# Patient Record
Sex: Female | Born: 1970 | Race: White | Hispanic: No | Marital: Married | Smoking: Never smoker
Health system: Southern US, Community
[De-identification: ages and names within clinical notes are randomized; demographics above are authoritative.]

## PROBLEM LIST (undated history)

## (undated) DIAGNOSIS — I10 Essential (primary) hypertension: Secondary | ICD-10-CM

## (undated) DIAGNOSIS — N183 Chronic kidney disease, stage 3 unspecified: Secondary | ICD-10-CM

## (undated) DIAGNOSIS — E785 Hyperlipidemia, unspecified: Secondary | ICD-10-CM

## (undated) DIAGNOSIS — R7303 Prediabetes: Secondary | ICD-10-CM

## (undated) DIAGNOSIS — Z789 Other specified health status: Secondary | ICD-10-CM

## (undated) HISTORY — PX: ABDOMINAL HYSTERECTOMY: SHX81

## (undated) HISTORY — PX: WISDOM TOOTH EXTRACTION: SHX21

---

## 1999-09-30 HISTORY — PX: BREAST SURGERY: SHX581

## 1999-09-30 HISTORY — PX: BREAST EXCISIONAL BIOPSY: SUR124

## 1999-09-30 HISTORY — PX: BREAST BIOPSY: SHX20

## 2013-02-22 ENCOUNTER — Ambulatory Visit: Payer: Self-pay | Admitting: Family Medicine

## 2013-12-06 ENCOUNTER — Encounter (HOSPITAL_BASED_OUTPATIENT_CLINIC_OR_DEPARTMENT_OTHER): Payer: Self-pay | Admitting: *Deleted

## 2013-12-07 NOTE — H&P (Signed)
  Barnie Sopko/[facility] [address]   [address] [place], [place] [id number] [phone] A Division of [facility]  Loreta Aveaniel F. Juliah Scadden, M.D.   Robert A. Thurston HoleWainer, M.D.   Burnell BlanksW. Dan Caffrey, M.D.   Eulas PostJoshua P. Landau, M.D.   Lunette StandsAnna Voytek, M.D Jewel Baizeimothy D. Eulah PontMurphy, M.D.  Buford DresserWesley R. Ibazebo, M.D.  Estell HarpinJames S. Kramer, M.D.    Melina Fiddlerebecca S. Bassett, M.D. Mary L. Isidoro DonningAnton, PA-C  Kirstin A. Shepperson, PA-C  Josh Urbannahadwell, PA-C [place]andon Parry, [place]A-C   RE: Jaclyn DaneSomers, Latrisha   [phone]      DOB: 08/31/1971 PROGRESS NOTE: 12-06-13 Reason for visit:  [facility]. History of present illness: 43 year old woman who tripped over an acorn and fell hurting her right ankle.    Please see associated documentation for this clinic visit for further past medical, family, surgical and social history, review of systems, and exam findings as this was reviewed by me.  EXAMINATION: Well appearing female in no apparent distress. Right lower extremity splint is intact she is able to wiggle her toes, her toes are warm and well profused. No other injury.  X-RAYS: X-rays reviewed by me: 3 views obtained demonstrate a right ankle fracture.   ASSESSMENT: Right bimalleolar ankle fracture.  PLAN: 1. Plan for open reduction internal fixation at next available time. 2. I advised her to elevate as much as possible before and after surgery 3. I will see her at my next available surgical appointment.  Jewel Baizeimothy D.  Eulah PontMurphy, M.D.  Electronically verified by Jewel Baizeimothy D. Eulah PontMurphy, M.D. TDM:kah D 12-06-13 T 12-07-13

## 2013-12-08 ENCOUNTER — Ambulatory Visit (HOSPITAL_BASED_OUTPATIENT_CLINIC_OR_DEPARTMENT_OTHER): Payer: 59 | Admitting: Anesthesiology

## 2013-12-08 ENCOUNTER — Encounter (HOSPITAL_BASED_OUTPATIENT_CLINIC_OR_DEPARTMENT_OTHER)
Admission: RE | Disposition: A | Discharge status: Home / Self Care | Payer: Self-pay | Source: Ambulatory Visit | Attending: Orthopedic Surgery

## 2013-12-08 ENCOUNTER — Encounter (HOSPITAL_BASED_OUTPATIENT_CLINIC_OR_DEPARTMENT_OTHER): Payer: 59 | Admitting: Anesthesiology

## 2013-12-08 ENCOUNTER — Ambulatory Visit (HOSPITAL_BASED_OUTPATIENT_CLINIC_OR_DEPARTMENT_OTHER)
Admission: RE | Admit: 2013-12-08 | Discharge: 2013-12-08 | Disposition: A | Discharge status: Home / Self Care | Payer: 59 | Source: Ambulatory Visit | Attending: Orthopedic Surgery | Admitting: Orthopedic Surgery

## 2013-12-08 ENCOUNTER — Encounter (HOSPITAL_BASED_OUTPATIENT_CLINIC_OR_DEPARTMENT_OTHER): Payer: Self-pay | Admitting: *Deleted

## 2013-12-08 DIAGNOSIS — S82853A Displaced trimalleolar fracture of unspecified lower leg, initial encounter for closed fracture: Secondary | ICD-10-CM | POA: Insufficient documentation

## 2013-12-08 DIAGNOSIS — W010XXA Fall on same level from slipping, tripping and stumbling without subsequent striking against object, initial encounter: Secondary | ICD-10-CM | POA: Insufficient documentation

## 2013-12-08 DIAGNOSIS — S82891A Other fracture of right lower leg, initial encounter for closed fracture: Secondary | ICD-10-CM

## 2013-12-08 HISTORY — DX: Other specified health status: Z78.9

## 2013-12-08 HISTORY — PX: ORIF ANKLE FRACTURE: SHX5408

## 2013-12-08 LAB — POCT HEMOGLOBIN-HEMACUE: Hemoglobin: 12.7 g/dL (ref 12.0–15.0)

## 2013-12-08 SURGERY — OPEN REDUCTION INTERNAL FIXATION (ORIF) ANKLE FRACTURE
Anesthesia: General | Site: Ankle | Laterality: Right

## 2013-12-08 MED ORDER — HYDROMORPHONE HCL PF 1 MG/ML IJ SOLN: 0.5000 mg | INTRAMUSCULAR | Status: DC | PRN

## 2013-12-08 MED ORDER — DEXTROSE-NACL 5-0.45 % IV SOLN: 100.0000 mL/h | INTRAVENOUS | Status: DC

## 2013-12-08 MED ORDER — DEXAMETHASONE SODIUM PHOSPHATE 10 MG/ML IJ SOLN
INTRAMUSCULAR | Status: DC | PRN
  Administered 2013-12-08: 6 mg

## 2013-12-08 MED ORDER — MIDAZOLAM HCL 2 MG/2ML IJ SOLN
INTRAMUSCULAR | Status: AC
  Filled 2013-12-08: qty 2

## 2013-12-08 MED ORDER — DEXAMETHASONE SODIUM PHOSPHATE 4 MG/ML IJ SOLN
INTRAMUSCULAR | Status: DC | PRN
  Administered 2013-12-08: 10 mg via INTRAVENOUS

## 2013-12-08 MED ORDER — CEFAZOLIN SODIUM-DEXTROSE 2-3 GM-% IV SOLR
INTRAVENOUS | Status: AC
  Filled 2013-12-08: qty 50

## 2013-12-08 MED ORDER — DOCUSATE SODIUM 100 MG PO CAPS: 100.0000 mg | ORAL_CAPSULE | Freq: Two times a day (BID) | ORAL | Status: DC

## 2013-12-08 MED ORDER — METOCLOPRAMIDE HCL 5 MG/ML IJ SOLN: 5.0000 mg | Freq: Three times a day (TID) | INTRAMUSCULAR | Status: DC | PRN

## 2013-12-08 MED ORDER — FENTANYL CITRATE 0.05 MG/ML IJ SOLN
INTRAMUSCULAR | Status: AC
  Filled 2013-12-08: qty 6

## 2013-12-08 MED ORDER — CEFAZOLIN SODIUM-DEXTROSE 2-3 GM-% IV SOLR
2.0000 g | INTRAVENOUS | Status: AC
  Administered 2013-12-08: 2 g via INTRAVENOUS

## 2013-12-08 MED ORDER — OXYCODONE HCL 5 MG PO TABS: 5.0000 mg | ORAL_TABLET | ORAL | Status: DC | PRN

## 2013-12-08 MED ORDER — BUPIVACAINE-EPINEPHRINE PF 0.5-1:200000 % IJ SOLN
INTRAMUSCULAR | Status: DC | PRN
  Administered 2013-12-08: 150 mg via PERINEURAL

## 2013-12-08 MED ORDER — ONDANSETRON HCL 4 MG/2ML IJ SOLN: 4.0000 mg | Freq: Four times a day (QID) | INTRAMUSCULAR | Status: DC | PRN

## 2013-12-08 MED ORDER — OXYCODONE HCL 5 MG PO TABS: 5.0000 mg | ORAL_TABLET | Freq: Once | ORAL | Status: DC | PRN

## 2013-12-08 MED ORDER — ONDANSETRON HCL 4 MG/2ML IJ SOLN
INTRAMUSCULAR | Status: DC | PRN
  Administered 2013-12-08: 4 mg via INTRAVENOUS

## 2013-12-08 MED ORDER — FENTANYL CITRATE 0.05 MG/ML IJ SOLN
INTRAMUSCULAR | Status: AC
  Filled 2013-12-08: qty 4

## 2013-12-08 MED ORDER — ASPIRIN EC 81 MG PO TBEC: 81.0000 mg | DELAYED_RELEASE_TABLET | Freq: Every day | ORAL | Status: DC

## 2013-12-08 MED ORDER — FENTANYL CITRATE 0.05 MG/ML IJ SOLN
INTRAMUSCULAR | Status: AC
  Filled 2013-12-08: qty 2

## 2013-12-08 MED ORDER — LIDOCAINE HCL (CARDIAC) 20 MG/ML IV SOLN
INTRAVENOUS | Status: DC | PRN
  Administered 2013-12-08: 40 mg via INTRAVENOUS

## 2013-12-08 MED ORDER — FENTANYL CITRATE 0.05 MG/ML IJ SOLN
INTRAMUSCULAR | Status: DC | PRN
  Administered 2013-12-08: 25 ug via INTRAVENOUS
  Administered 2013-12-08: 50 ug via INTRAVENOUS

## 2013-12-08 MED ORDER — PROMETHAZINE HCL 25 MG/ML IJ SOLN: 6.2500 mg | INTRAMUSCULAR | Status: DC | PRN

## 2013-12-08 MED ORDER — PROPOFOL 10 MG/ML IV BOLUS
INTRAVENOUS | Status: DC | PRN
  Administered 2013-12-08: 200 mg via INTRAVENOUS

## 2013-12-08 MED ORDER — ONDANSETRON HCL 4 MG PO TABS: 4.0000 mg | ORAL_TABLET | Freq: Three times a day (TID) | ORAL | Status: DC | PRN

## 2013-12-08 MED ORDER — LACTATED RINGERS IV SOLN
INTRAVENOUS | Status: DC
  Administered 2013-12-08: 11:00:00 via INTRAVENOUS

## 2013-12-08 MED ORDER — MIDAZOLAM HCL 2 MG/2ML IJ SOLN
1.0000 mg | INTRAMUSCULAR | Status: DC | PRN
Start: 2013-12-08 — End: 2013-12-08
  Administered 2013-12-08: 2 mg via INTRAVENOUS

## 2013-12-08 MED ORDER — HYDROMORPHONE HCL PF 1 MG/ML IJ SOLN: 0.2500 mg | INTRAMUSCULAR | Status: DC | PRN

## 2013-12-08 MED ORDER — OXYCODONE-ACETAMINOPHEN 5-325 MG PO TABS: 1.0000 | ORAL_TABLET | ORAL | Status: DC | PRN

## 2013-12-08 MED ORDER — ACETAMINOPHEN 500 MG PO TABS: 1000.0000 mg | ORAL_TABLET | Freq: Once | ORAL | Status: DC

## 2013-12-08 MED ORDER — METOCLOPRAMIDE HCL 5 MG PO TABS: 5.0000 mg | ORAL_TABLET | Freq: Three times a day (TID) | ORAL | Status: DC | PRN

## 2013-12-08 MED ORDER — FENTANYL CITRATE 0.05 MG/ML IJ SOLN
50.0000 ug | INTRAMUSCULAR | Status: DC | PRN
  Administered 2013-12-08: 100 ug via INTRAVENOUS

## 2013-12-08 MED ORDER — ONDANSETRON HCL 4 MG PO TABS: 4.0000 mg | ORAL_TABLET | Freq: Four times a day (QID) | ORAL | Status: DC | PRN

## 2013-12-08 MED ORDER — OXYCODONE HCL 5 MG/5ML PO SOLN: 5.0000 mg | Freq: Once | ORAL | Status: DC | PRN

## 2013-12-08 SURGICAL SUPPLY — 72 items
BANDAGE ELASTIC 4 VELCRO ST LF (GAUZE/BANDAGES/DRESSINGS) ×2 IMPLANT
BANDAGE ELASTIC 6 VELCRO ST LF (GAUZE/BANDAGES/DRESSINGS) ×2 IMPLANT
BANDAGE ESMARK 6X9 LF (GAUZE/BANDAGES/DRESSINGS) ×1 IMPLANT
BIT DRILL 2.5X125 (BIT) ×2 IMPLANT
BIT DRILL 3.5X125 (BIT) ×1 IMPLANT
BLADE SURG 15 STRL LF DISP TIS (BLADE) ×2 IMPLANT
BLADE SURG 15 STRL SS (BLADE) ×2
BNDG COHESIVE 3X5 TAN STRL LF (GAUZE/BANDAGES/DRESSINGS) ×2 IMPLANT
BNDG COHESIVE 4X5 TAN STRL (GAUZE/BANDAGES/DRESSINGS) IMPLANT
BNDG ESMARK 6X9 LF (GAUZE/BANDAGES/DRESSINGS) ×2
BRUSH SCRUB EZ PLAIN DRY (MISCELLANEOUS) ×2 IMPLANT
CHLORAPREP W/TINT 26ML (MISCELLANEOUS) IMPLANT
COVER MAYO STAND STRL (DRAPES) ×2 IMPLANT
COVER TABLE BACK 60X90 (DRAPES) ×2 IMPLANT
CUFF TOURNIQUET SINGLE 24IN (TOURNIQUET CUFF) IMPLANT
CUFF TOURNIQUET SINGLE 34IN LL (TOURNIQUET CUFF) ×2 IMPLANT
DECANTER SPIKE VIAL GLASS SM (MISCELLANEOUS) IMPLANT
DRAPE C-ARM 42X72 X-RAY (DRAPES) IMPLANT
DRAPE C-ARMOR (DRAPES) IMPLANT
DRAPE EXTREMITY T 121X128X90 (DRAPE) ×2 IMPLANT
DRAPE OEC MINIVIEW 54X84 (DRAPES) ×2 IMPLANT
DRAPE U 20/CS (DRAPES) ×2 IMPLANT
DRAPE U-SHAPE 47X51 STRL (DRAPES) ×2 IMPLANT
DRILL BIT 3.5X125 (BIT) ×1
DRSG EMULSION OIL 3X3 NADH (GAUZE/BANDAGES/DRESSINGS) ×4 IMPLANT
ELECT REM PT RETURN 9FT ADLT (ELECTROSURGICAL) ×2
ELECTRODE REM PT RTRN 9FT ADLT (ELECTROSURGICAL) ×1 IMPLANT
GLOVE BIO SURGEON STRL SZ7 (GLOVE) ×2 IMPLANT
GLOVE BIO SURGEON STRL SZ7.5 (GLOVE) ×2 IMPLANT
GLOVE BIOGEL PI IND STRL 7.0 (GLOVE) ×3 IMPLANT
GLOVE BIOGEL PI IND STRL 8 (GLOVE) ×1 IMPLANT
GLOVE BIOGEL PI INDICATOR 7.0 (GLOVE) ×3
GLOVE BIOGEL PI INDICATOR 8 (GLOVE) ×1
GLOVE ECLIPSE 6.5 STRL STRAW (GLOVE) ×2 IMPLANT
GOWN STRL REUS W/ TWL LRG LVL3 (GOWN DISPOSABLE) ×1 IMPLANT
GOWN STRL REUS W/ TWL XL LVL3 (GOWN DISPOSABLE) ×2 IMPLANT
GOWN STRL REUS W/TWL LRG LVL3 (GOWN DISPOSABLE) ×1
GOWN STRL REUS W/TWL XL LVL3 (GOWN DISPOSABLE) ×2
NEEDLE HYPO 22GX1.5 SAFETY (NEEDLE) IMPLANT
NS IRRIG 1000ML POUR BTL (IV SOLUTION) ×2 IMPLANT
PACK BASIN DAY SURGERY FS (CUSTOM PROCEDURE TRAY) ×2 IMPLANT
PAD ABD 8X10 STRL (GAUZE/BANDAGES/DRESSINGS) ×2 IMPLANT
PAD CAST 4YDX4 CTTN HI CHSV (CAST SUPPLIES) ×1 IMPLANT
PADDING CAST COTTON 4X4 STRL (CAST SUPPLIES) ×1
PADDING CAST COTTON 6X4 STRL (CAST SUPPLIES) ×2 IMPLANT
PENCIL BUTTON HOLSTER BLD 10FT (ELECTRODE) ×2 IMPLANT
PLATE TUBUAL 1/3 6H (Plate) ×2 IMPLANT
SCREW CANCELLOUS 4.0X14 (Screw) ×4 IMPLANT
SCREW CORT 10X3.5XST NS LF (Screw) ×1 IMPLANT
SCREW CORTEX ST MATTA 3.5X12MM (Screw) ×6 IMPLANT
SCREW CORTEX ST MATTA 3.5X22MM (Screw) ×2 IMPLANT
SCREW CORTEX ST MATTA 3.5X26MM (Screw) ×2 IMPLANT
SCREW CORTICAL 3.5 (Screw) ×1 IMPLANT
SLEEVE SCD COMPRESS KNEE MED (MISCELLANEOUS) ×2 IMPLANT
SPLINT FAST PLASTER 5X30 (CAST SUPPLIES) ×10
SPLINT PLASTER CAST FAST 5X30 (CAST SUPPLIES) ×10 IMPLANT
SPONGE GAUZE 4X4 12PLY (GAUZE/BANDAGES/DRESSINGS) ×2 IMPLANT
SPONGE LAP 4X18 X RAY DECT (DISPOSABLE) ×2 IMPLANT
STRIP CLOSURE SKIN 1/2X4 (GAUZE/BANDAGES/DRESSINGS) ×2 IMPLANT
SUCTION FRAZIER TIP 10 FR DISP (SUCTIONS) ×2 IMPLANT
SUT MON AB 2-0 CT1 36 (SUTURE) ×2 IMPLANT
SUT MON AB 4-0 PC3 18 (SUTURE) ×2 IMPLANT
SUT VIC AB 0 SH 27 (SUTURE) ×2 IMPLANT
SUT VIC AB 2-0 SH 27 (SUTURE)
SUT VIC AB 2-0 SH 27XBRD (SUTURE) IMPLANT
SYR 20CC LL (SYRINGE) IMPLANT
SYR BULB 3OZ (MISCELLANEOUS) ×2 IMPLANT
TOWEL OR 17X24 6PK STRL BLUE (TOWEL DISPOSABLE) ×4 IMPLANT
TOWEL OR NON WOVEN STRL DISP B (DISPOSABLE) ×2 IMPLANT
TRAY DSU PREP LF (CUSTOM PROCEDURE TRAY) ×2 IMPLANT
TUBE CONNECTING 20X1/4 (TUBING) ×2 IMPLANT
UNDERPAD 30X30 INCONTINENT (UNDERPADS AND DIAPERS) ×2 IMPLANT

## 2013-12-08 NOTE — Transfer of Care (Signed)
Immediate Anesthesia Transfer of Care Note  Patient: Jaclyn Rodriguez  Procedure(s) Performed: Procedure(s): RIGHT ANKLE: FRACTURE OPEN TREATMENT TRIMALLEOLARD ANKLE INCLUDES INTERNAL FIXATION WITHOUT FIXATION OF POSTERIOR LIP, FRACTURE OPEN TREATMENT DISTAL TIBIOFIBULAR INCLUDES INTERNAL FIXATION (Right)  Patient Location: PACU  Anesthesia Type:General  Level of Consciousness: awake and sedated  Airway & Oxygen Therapy: Patient Spontanous Breathing and Patient connected to face mask oxygen  Post-op Assessment: Report given to PACU RN and Post -op Vital signs reviewed and stable  Post vital signs: Reviewed and stable  Complications: No apparent anesthesia complications

## 2013-12-08 NOTE — Anesthesia Procedure Notes (Addendum)
Anesthesia Regional Block:  Popliteal block  Pre-Anesthetic Checklist: ,, timeout performed, Correct Patient, Correct Site, Correct Laterality, Correct Procedure, Correct Position, site marked, Risks and benefits discussed,  Surgical consent,  Pre-op evaluation,  At surgeon's request and post-op pain management  Laterality: Right  Prep: chloraprep       Needles:  Injection technique: Single-shot  Needle Type: Echogenic Stimulator Needle          Additional Needles:  Procedures: ultrasound guided (picture in chart) and nerve stimulator Popliteal block  Nerve Stimulator or Paresthesia:  Response: plantar flexion, 0.45 mA,   Additional Responses:   Narrative:  Start time: 12/08/2013 11:27 AM End time: 12/08/2013 11:37 AM  Performed by: Personally  Anesthesiologist: J. Adonis Hugueninan Singer, MD  Additional Notes: A functioning IV was confirmed and monitors were applied.  Sterile prep and drape, hand hygiene and sterile gloves were used.  Negative aspiration and test dose prior to incremental administration of local anesthetic. The patient tolerated the procedure well.Ultrasound  guidance: relevant anatomy identified, needle position confirmed, local anesthetic spread visualized around nerve(s), vascular puncture avoided.  Image printed for medical record.    Procedure Name: LMA Insertion Performed by: York GricePEARSON, Ian Cavey W Pre-anesthesia Checklist: Patient identified, Timeout performed, Emergency Drugs available, Suction available and Patient being monitored Patient Re-evaluated:Patient Re-evaluated prior to inductionOxygen Delivery Method: Circle system utilized Preoxygenation: Pre-oxygenation with 100% oxygen Intubation Type: IV induction Ventilation: Mask ventilation without difficulty LMA: LMA inserted LMA Size: 4.0 Tube type: Oral Number of attempts: 1 Placement Confirmation: breath sounds checked- equal and bilateral and positive ETCO2 Tube secured with: Tape Dental Injury: Teeth  and Oropharynx as per pre-operative assessment

## 2013-12-08 NOTE — Interval H&P Note (Signed)
History and Physical Interval Note:  12/08/2013 10:41 AM  Jaclyn Rodriguez  has presented today for surgery, with the diagnosis of Right Ankle Trimalleolar Fracture - Closed  The various methods of treatment have been discussed with the patient and family. After consideration of risks, benefits and other options for treatment, the patient has consented to  Procedure(s): RIGHT ANKLE: FRACTURE OPEN TREATMENT TRIMALLEOLARD ANKLE INCLUDES INTERNAL FIXATION WITHOUT FIXATION OF POSTERIOR LIP, FRACTURE OPEN TREATMENT DISTAL TIBIOFIBULAR INCLUDES INTERNAL FIXATION (Right) as a surgical intervention .  The patient's history has been reviewed, patient examined, no change in status, stable for surgery.  I have reviewed the patient's chart and labs.  Questions were answered to the patient's satisfaction.     Marl Seago, D

## 2013-12-08 NOTE — Anesthesia Preprocedure Evaluation (Signed)
Anesthesia Evaluation  Patient identified by MRN, date of birth, ID band Patient awake    Reviewed: Allergy & Precautions, H&P , NPO status , Patient's Chart, lab work & pertinent test results  Airway Mallampati: II TM Distance: >3 FB Neck ROM: full    Dental  (+) Teeth Intact, Dental Advidsory Given   Pulmonary neg pulmonary ROS,  breath sounds clear to auscultation        Cardiovascular negative cardio ROS  Rhythm:regular Rate:Normal     Neuro/Psych negative neurological ROS  negative psych ROS   GI/Hepatic negative GI ROS, Neg liver ROS,   Endo/Other  negative endocrine ROS  Renal/GU negative Renal ROS     Musculoskeletal   Abdominal   Peds  Hematology   Anesthesia Other Findings   Reproductive/Obstetrics negative OB ROS                           Anesthesia Physical Anesthesia Plan  ASA: I  Anesthesia Plan: General LMA   Post-op Pain Management: MAC Combined w/ Regional for Post-op pain   Induction:   Airway Management Planned:   Additional Equipment:   Intra-op Plan:   Post-operative Plan:   Informed Consent: I have reviewed the patients History and Physical, chart, labs and discussed the procedure including the risks, benefits and alternatives for the proposed anesthesia with the patient or authorized representative who has indicated his/her understanding and acceptance.   Dental Advisory Given  Plan Discussed with: Anesthesiologist, CRNA and Surgeon  Anesthesia Plan Comments:         Anesthesia Quick Evaluation

## 2013-12-08 NOTE — Discharge Instructions (Signed)
Non-weight bearing.  Must stay in splint until follow up appointment.  Take Aspirin 1 tab a day for the next 30 days to prevent blood clots. May shower, but do not let splint get wet.  May ice for up to 20 minutes at a time for pain and swelling.  Follow up appointment in one week or as scheduled.   SEEK MEDICAL CARE IF: You have swelling of your calf or leg.  You develop shortness of breath or chest pain.  You have redness, swelling, or increasing pain in the wound.  There is pus or any unusual drainage coming from the surgical site.  You notice a bad smell coming from the surgical site or dressing.  The surgical site breaks open after sutures or staples have been removed.  There is persistent bleeding from the suture or staple line.  You are getting worse or are not improving.  You have any other questions or concerns.  SEEK IMMEDIATE MEDICAL CARE IF:  You have a fever.  You develop a rash.  You have difficulty breathing.  You develop any reaction or side effects to medicines given.  Your knee motion is decreasing rather than improving.  MAKE SURE YOU:  Understand these instructions.  Will watch your condition.  Will get help right away if you are not doing well or get worse.   Regional Anesthesia Blocks  1. Numbness or the inability to move the "blocked" extremity may last from 3-48 hours after placement. The length of time depends on the medication injected and your individual response to the medication. If the numbness is not going away after 48 hours, call your surgeon.  2. The extremity that is blocked will need to be protected until the numbness is gone and the  Strength has returned. Because you cannot feel it, you will need to take extra care to avoid injury. Because it may be weak, you may have difficulty moving it or using it. You may not know what position it is in without looking at it while the block is in effect.  3. For blocks in the legs and feet, returning to weight  bearing and walking needs to be done carefully. You will need to wait until the numbness is entirely gone and the strength has returned. You should be able to move your leg and foot normally before you try and bear weight or walk. You will need someone to be with you when you first try to ensure you do not fall and possibly risk injury.  4. Bruising and tenderness at the needle site are common side effects and will resolve in a few days.  5. Persistent numbness or new problems with movement should be communicated to the surgeon or the [facility] [phone])/ [facility]  [phone]).  Post Anesthesia Home Care Instructions  Activity: Get plenty of rest for the remainder of the day. A responsible adult should stay with you for 24 hours following the procedure.  For the next 24 hours, DO NOT: -Drive a car -Advertising copywriterperate machinery -Drink alcoholic beverages -Take any medication unless instructed by your physician -Make any legal decisions or sign important papers.  Meals: Start with liquid foods such as gelatin or soup. Progress to regular foods as tolerated. Avoid greasy, spicy, heavy foods. If nausea and/or vomiting occur, drink only clear liquids until the nausea and/or vomiting subsides. Call your physician if vomiting continues.  Special Instructions/Symptoms: Your throat may feel dry or sore from the anesthesia or the  breathing tube placed in your throat during surgery. If this causes discomfort, gargle with warm salt water. The discomfort should disappear within 24 hours.

## 2013-12-08 NOTE — Progress Notes (Signed)
Assisted Dr. Singer with right, ultrasound guided, popliteal/saphenous block. Side rails up, monitors on throughout procedure. See vital signs in flow sheet. Tolerated Procedure well. 

## 2013-12-08 NOTE — Anesthesia Postprocedure Evaluation (Signed)
Anesthesia Post Note  Patient: Jaclyn Rodriguez  Procedure(s) Performed: Procedure(s) (LRB): RIGHT ANKLE: FRACTURE OPEN TREATMENT TRIMALLEOLARD ANKLE INCLUDES INTERNAL FIXATION WITHOUT FIXATION OF POSTERIOR LIP, FRACTURE OPEN TREATMENT DISTAL TIBIOFIBULAR INCLUDES INTERNAL FIXATION (Right)  Anesthesia type: general  Patient location: PACU  Post pain: Pain level controlled  Post assessment: Patient's Cardiovascular Status Stable  Last Vitals:  Filed Vitals:   12/08/13 1415  BP: 126/64  Pulse: 97  Temp:   Resp: 19    Post vital signs: Reviewed and stable  Level of consciousness: sedated  Complications: No apparent anesthesia complications

## 2013-12-08 NOTE — Op Note (Signed)
12/08/2013  12:42 PM  PATIENT:  Jaclyn DaneNorah Rodriguez    PRE-OPERATIVE DIAGNOSIS:  Right Ankle Trimalleolar Fracture - Closed  POST-OPERATIVE DIAGNOSIS:  Same  PROCEDURE:  RIGHT ANKLE: FRACTURE OPEN TREATMENT TRIMALLEOLARD ANKLE INCLUDES INTERNAL FIXATION WITHOUT FIXATION OF POSTERIOR LIP, FRACTURE OPEN TREATMENT DISTAL TIBIOFIBULAR INCLUDES INTERNAL FIXATION  SURGEON:  Lonny Eisen, D, MD  ASSISTANT: Odelia GageLindsey Anton PA  ANESTHESIA:   Gen  PREOPERATIVE INDICATIONS:  Jaclyn Rodriguez is a  43 y.o. female with a diagnosis of Right Ankle Trimalleolar Fracture - Closed who failed conservative measures and elected for surgical management.    The risks benefits and alternatives were discussed with the patient preoperatively including but not limited to the risks of infection, bleeding, nerve injury, cardiopulmonary complications, the need for revision surgery, among others, and the patient was willing to proceed.  OPERATIVE IMPLANTS: 1/3 tubular plate  OPERATIVE FINDINGS: Unstable ankle fracture. Stable syndesmosis post op  BLOOD LOSS: min  COMPLICATIONS: none  TOURNIQUET TIME: 45  OPERATIVE PROCEDURE:  Patient was identified in the preoperative holding area and site was marked by me He was transported to the operating theater and placed on the table in supine position taking care to pad all bony prominences. After a preincinduction time out anesthesia was induced. The right lower extremity was prepped and draped in normal sterile fashion and a pre-incision timeout was performed. Jaclyn Rodriguez received ancef for preoperative antibiotics.   I made a lateral incision of roughly 7 cm dissection was carried down sharply to the distal fibula and then spreading dissection was used proximally to protect the superficial peroneal nerve. I sharply incised the periosteum and took care to protect the peroneal tendons. I then debrided the fracture site and performed a reduction maneuver which was held in place with  a clamp.   I placed a lag screw across the fracture  I then selected a 6-hole one third tubular plate and placed in a neutralization fashion care was taken distally so as not to penetrate the joint with the cancellus screws.  I then stressed the syndesmosis and it was stable  The wound was then thoroughly irrigated and closed using a 0 Vicryl and absorbable Monocryl sutures. He was placed in a short leg splint.   POST OPERATIVE PLAN: Non-weightbearing. DVT prophylaxis will consist of ASA 81mg 

## 2013-12-08 NOTE — Interval H&P Note (Signed)
History and Physical Interval Note:  12/08/2013 10:41 AM  Jaclyn Rodriguez  has presented today for surgery, with the diagnosis of Right Ankle Trimalleolar Fracture - Closed  The various methods of treatment have been discussed with the patient and family. After consideration of risks, benefits and other options for treatment, the patient has consented to  Procedure(s): RIGHT ANKLE: FRACTURE OPEN TREATMENT TRIMALLEOLARD ANKLE INCLUDES INTERNAL FIXATION WITHOUT FIXATION OF POSTERIOR LIP, FRACTURE OPEN TREATMENT DISTAL TIBIOFIBULAR INCLUDES INTERNAL FIXATION (Right) as a surgical intervention .  The patient's history has been reviewed, patient examined, no change in status, stable for surgery.  I have reviewed the patient's chart and labs.  Questions were answered to the patient's satisfaction.     Wadsworth Skolnick, D   

## 2013-12-12 ENCOUNTER — Encounter (HOSPITAL_BASED_OUTPATIENT_CLINIC_OR_DEPARTMENT_OTHER): Payer: Self-pay | Admitting: Orthopedic Surgery

## 2014-02-23 ENCOUNTER — Ambulatory Visit: Payer: Self-pay | Admitting: Family Medicine

## 2015-01-29 ENCOUNTER — Other Ambulatory Visit: Payer: Self-pay | Admitting: Family Medicine

## 2015-01-29 DIAGNOSIS — Z1231 Encounter for screening mammogram for malignant neoplasm of breast: Secondary | ICD-10-CM

## 2015-03-08 ENCOUNTER — Ambulatory Visit
Admission: RE | Admit: 2015-03-08 | Discharge: 2015-03-08 | Disposition: A | Discharge status: Home / Self Care | Payer: 59 | Source: Ambulatory Visit | Attending: Family Medicine | Admitting: Family Medicine

## 2015-03-08 DIAGNOSIS — Z1231 Encounter for screening mammogram for malignant neoplasm of breast: Secondary | ICD-10-CM | POA: Diagnosis not present

## 2015-09-11 ENCOUNTER — Other Ambulatory Visit: Payer: Self-pay | Admitting: Obstetrics and Gynecology

## 2015-09-11 DIAGNOSIS — Z1231 Encounter for screening mammogram for malignant neoplasm of breast: Secondary | ICD-10-CM

## 2016-03-10 ENCOUNTER — Ambulatory Visit
Admission: RE | Admit: 2016-03-10 | Discharge: 2016-03-10 | Disposition: A | Discharge status: Home / Self Care | Payer: 59 | Source: Ambulatory Visit | Attending: Obstetrics and Gynecology | Admitting: Obstetrics and Gynecology

## 2016-03-10 DIAGNOSIS — Z1231 Encounter for screening mammogram for malignant neoplasm of breast: Secondary | ICD-10-CM | POA: Diagnosis present

## 2016-11-26 DIAGNOSIS — Z01419 Encounter for gynecological examination (general) (routine) without abnormal findings: Secondary | ICD-10-CM | POA: Diagnosis not present

## 2017-01-28 ENCOUNTER — Other Ambulatory Visit: Payer: Self-pay | Admitting: Obstetrics and Gynecology

## 2017-01-28 DIAGNOSIS — Z1231 Encounter for screening mammogram for malignant neoplasm of breast: Secondary | ICD-10-CM

## 2017-03-11 ENCOUNTER — Encounter (HOSPITAL_COMMUNITY): Payer: Self-pay

## 2017-03-11 ENCOUNTER — Ambulatory Visit
Admission: RE | Admit: 2017-03-11 | Discharge: 2017-03-11 | Disposition: A | Discharge status: Home / Self Care | Payer: BLUE CROSS/BLUE SHIELD | Source: Ambulatory Visit | Attending: Obstetrics and Gynecology | Admitting: Obstetrics and Gynecology

## 2017-03-11 DIAGNOSIS — Z1231 Encounter for screening mammogram for malignant neoplasm of breast: Secondary | ICD-10-CM | POA: Insufficient documentation

## 2017-05-04 ENCOUNTER — Other Ambulatory Visit: Payer: Self-pay | Admitting: Family Medicine

## 2017-05-04 DIAGNOSIS — R1909 Other intra-abdominal and pelvic swelling, mass and lump: Secondary | ICD-10-CM

## 2017-05-04 DIAGNOSIS — Z0000 Encounter for general adult medical examination without abnormal findings: Secondary | ICD-10-CM | POA: Diagnosis not present

## 2017-05-04 DIAGNOSIS — E781 Pure hyperglyceridemia: Secondary | ICD-10-CM | POA: Diagnosis not present

## 2017-05-04 DIAGNOSIS — R7303 Prediabetes: Secondary | ICD-10-CM | POA: Diagnosis not present

## 2017-05-19 ENCOUNTER — Ambulatory Visit
Admission: RE | Admit: 2017-05-19 | Discharge: 2017-05-19 | Disposition: A | Discharge status: Home / Self Care | Payer: 59 | Source: Ambulatory Visit | Attending: Family Medicine | Admitting: Family Medicine

## 2017-05-19 DIAGNOSIS — R1909 Other intra-abdominal and pelvic swelling, mass and lump: Secondary | ICD-10-CM | POA: Insufficient documentation

## 2017-05-19 DIAGNOSIS — R109 Unspecified abdominal pain: Secondary | ICD-10-CM | POA: Diagnosis not present

## 2017-05-19 DIAGNOSIS — R1900 Intra-abdominal and pelvic swelling, mass and lump, unspecified site: Secondary | ICD-10-CM | POA: Diagnosis not present

## 2017-05-19 MED ORDER — IOPAMIDOL (ISOVUE-300) INJECTION 61%
100.0000 mL | Freq: Once | INTRAVENOUS | Status: AC | PRN
  Administered 2017-05-19: 100 mL via INTRAVENOUS

## 2017-05-27 DIAGNOSIS — R1909 Other intra-abdominal and pelvic swelling, mass and lump: Secondary | ICD-10-CM | POA: Diagnosis not present

## 2017-10-30 DIAGNOSIS — J019 Acute sinusitis, unspecified: Secondary | ICD-10-CM | POA: Diagnosis not present

## 2017-10-30 DIAGNOSIS — R509 Fever, unspecified: Secondary | ICD-10-CM | POA: Diagnosis not present

## 2017-10-30 DIAGNOSIS — J40 Bronchitis, not specified as acute or chronic: Secondary | ICD-10-CM | POA: Diagnosis not present

## 2017-10-30 DIAGNOSIS — R05 Cough: Secondary | ICD-10-CM | POA: Diagnosis not present

## 2017-12-25 DIAGNOSIS — J019 Acute sinusitis, unspecified: Secondary | ICD-10-CM | POA: Diagnosis not present

## 2017-12-25 DIAGNOSIS — R05 Cough: Secondary | ICD-10-CM | POA: Diagnosis not present

## 2017-12-25 DIAGNOSIS — B9689 Other specified bacterial agents as the cause of diseases classified elsewhere: Secondary | ICD-10-CM | POA: Diagnosis not present

## 2018-02-26 DIAGNOSIS — J069 Acute upper respiratory infection, unspecified: Secondary | ICD-10-CM | POA: Diagnosis not present

## 2018-02-26 DIAGNOSIS — J029 Acute pharyngitis, unspecified: Secondary | ICD-10-CM | POA: Diagnosis not present

## 2018-03-02 ENCOUNTER — Other Ambulatory Visit: Payer: Self-pay | Admitting: Obstetrics and Gynecology

## 2018-03-02 DIAGNOSIS — Z1231 Encounter for screening mammogram for malignant neoplasm of breast: Secondary | ICD-10-CM

## 2018-03-02 DIAGNOSIS — Z01411 Encounter for gynecological examination (general) (routine) with abnormal findings: Secondary | ICD-10-CM | POA: Diagnosis not present

## 2018-03-31 ENCOUNTER — Encounter (HOSPITAL_COMMUNITY): Payer: Self-pay

## 2018-03-31 ENCOUNTER — Ambulatory Visit
Admission: RE | Admit: 2018-03-31 | Discharge: 2018-03-31 | Disposition: A | Discharge status: Home / Self Care | Payer: 59 | Source: Ambulatory Visit | Attending: Obstetrics and Gynecology | Admitting: Obstetrics and Gynecology

## 2018-03-31 DIAGNOSIS — Z1231 Encounter for screening mammogram for malignant neoplasm of breast: Secondary | ICD-10-CM

## 2018-05-05 DIAGNOSIS — E611 Iron deficiency: Secondary | ICD-10-CM | POA: Diagnosis not present

## 2018-05-05 DIAGNOSIS — E781 Pure hyperglyceridemia: Secondary | ICD-10-CM | POA: Diagnosis not present

## 2018-05-05 DIAGNOSIS — Z0000 Encounter for general adult medical examination without abnormal findings: Secondary | ICD-10-CM | POA: Diagnosis not present

## 2018-10-18 DIAGNOSIS — J069 Acute upper respiratory infection, unspecified: Secondary | ICD-10-CM | POA: Diagnosis not present

## 2018-11-08 DIAGNOSIS — E6609 Other obesity due to excess calories: Secondary | ICD-10-CM | POA: Diagnosis not present

## 2018-11-08 DIAGNOSIS — E611 Iron deficiency: Secondary | ICD-10-CM | POA: Diagnosis not present

## 2018-11-08 DIAGNOSIS — R7303 Prediabetes: Secondary | ICD-10-CM | POA: Diagnosis not present

## 2018-11-08 DIAGNOSIS — E781 Pure hyperglyceridemia: Secondary | ICD-10-CM | POA: Diagnosis not present

## 2019-03-08 ENCOUNTER — Other Ambulatory Visit: Payer: Self-pay | Admitting: Obstetrics and Gynecology

## 2019-03-08 DIAGNOSIS — N852 Hypertrophy of uterus: Secondary | ICD-10-CM

## 2019-03-08 DIAGNOSIS — Z1231 Encounter for screening mammogram for malignant neoplasm of breast: Secondary | ICD-10-CM

## 2019-03-08 DIAGNOSIS — Z86018 Personal history of other benign neoplasm: Secondary | ICD-10-CM

## 2019-03-18 ENCOUNTER — Ambulatory Visit
Admission: RE | Admit: 2019-03-18 | Discharge: 2019-03-18 | Disposition: A | Discharge status: Home / Self Care | Payer: 59 | Source: Ambulatory Visit | Attending: Obstetrics and Gynecology | Admitting: Obstetrics and Gynecology

## 2019-03-18 ENCOUNTER — Ambulatory Visit: Payer: 59

## 2019-03-18 ENCOUNTER — Other Ambulatory Visit: Payer: Self-pay

## 2019-03-18 DIAGNOSIS — Z86018 Personal history of other benign neoplasm: Secondary | ICD-10-CM | POA: Diagnosis present

## 2019-03-18 DIAGNOSIS — N852 Hypertrophy of uterus: Secondary | ICD-10-CM

## 2019-03-18 MED ORDER — GADOBUTROL 1 MMOL/ML IV SOLN
8.0000 mL | Freq: Once | INTRAVENOUS | Status: AC | PRN
  Administered 2019-03-18: 8 mL via INTRAVENOUS

## 2019-04-18 ENCOUNTER — Ambulatory Visit
Admission: RE | Admit: 2019-04-18 | Discharge: 2019-04-18 | Disposition: A | Discharge status: Home / Self Care | Payer: 59 | Source: Ambulatory Visit | Attending: Obstetrics and Gynecology | Admitting: Obstetrics and Gynecology

## 2019-04-18 ENCOUNTER — Other Ambulatory Visit: Payer: Self-pay

## 2019-04-18 DIAGNOSIS — Z1231 Encounter for screening mammogram for malignant neoplasm of breast: Secondary | ICD-10-CM

## 2020-03-13 ENCOUNTER — Other Ambulatory Visit: Payer: Self-pay | Admitting: Obstetrics and Gynecology

## 2020-03-13 DIAGNOSIS — Z1231 Encounter for screening mammogram for malignant neoplasm of breast: Secondary | ICD-10-CM

## 2020-04-18 ENCOUNTER — Ambulatory Visit
Admission: RE | Admit: 2020-04-18 | Discharge: 2020-04-18 | Disposition: A | Discharge status: Home / Self Care | Payer: 59 | Source: Ambulatory Visit | Attending: Obstetrics and Gynecology | Admitting: Obstetrics and Gynecology

## 2020-04-18 DIAGNOSIS — Z1231 Encounter for screening mammogram for malignant neoplasm of breast: Secondary | ICD-10-CM | POA: Diagnosis present

## 2020-05-24 ENCOUNTER — Other Ambulatory Visit: Payer: 59

## 2020-05-24 ENCOUNTER — Other Ambulatory Visit: Payer: Self-pay

## 2020-05-24 DIAGNOSIS — Z20822 Contact with and (suspected) exposure to covid-19: Secondary | ICD-10-CM

## 2020-05-25 LAB — NOVEL CORONAVIRUS, NAA: SARS-CoV-2, NAA: NOT DETECTED

## 2020-05-25 LAB — SARS-COV-2, NAA 2 DAY TAT

## 2020-09-13 ENCOUNTER — Ambulatory Visit: Payer: 59

## 2020-09-14 ENCOUNTER — Ambulatory Visit: Payer: 59

## 2020-09-15 ENCOUNTER — Ambulatory Visit: Payer: 59

## 2020-10-12 ENCOUNTER — Ambulatory Visit: Payer: 59

## 2021-03-19 ENCOUNTER — Other Ambulatory Visit: Payer: Self-pay | Admitting: Obstetrics and Gynecology

## 2021-03-19 DIAGNOSIS — Z1231 Encounter for screening mammogram for malignant neoplasm of breast: Secondary | ICD-10-CM

## 2021-05-02 ENCOUNTER — Other Ambulatory Visit: Payer: Self-pay

## 2021-05-02 ENCOUNTER — Ambulatory Visit
Admission: RE | Admit: 2021-05-02 | Discharge: 2021-05-02 | Disposition: A | Discharge status: Home / Self Care | Payer: 59 | Source: Ambulatory Visit | Attending: Obstetrics and Gynecology | Admitting: Obstetrics and Gynecology

## 2021-05-02 DIAGNOSIS — Z1231 Encounter for screening mammogram for malignant neoplasm of breast: Secondary | ICD-10-CM | POA: Diagnosis present

## 2021-08-01 ENCOUNTER — Ambulatory Visit: Payer: 59

## 2021-08-09 ENCOUNTER — Ambulatory Visit: Payer: 59 | Attending: Internal Medicine

## 2021-08-09 ENCOUNTER — Other Ambulatory Visit: Payer: Self-pay

## 2021-08-09 DIAGNOSIS — Z23 Encounter for immunization: Secondary | ICD-10-CM

## 2021-08-09 MED ORDER — PFIZER COVID-19 VAC BIVALENT 30 MCG/0.3ML IM SUSP
INTRAMUSCULAR | 0 refills | Status: DC
  Filled 2021-08-09: qty 0.3, 1d supply, fill #0

## 2021-08-09 NOTE — Progress Notes (Signed)
   Covid-19 Vaccination Clinic  Name:  Jaclyn Rodriguez    MRN: [id number] DOB: 1971/01/12  08/09/2021  Ms. [name] was observed post Covid-19 immunization for 15 minutes without incident. She was provided with Vaccine Information Sheet and instruction to access the V-Safe system.   Ms. [name] was instructed to call 911 with any severe reactions post vaccine: Difficulty breathing  Swelling of face and throat  A fast heartbeat  A bad rash all over body  Dizziness and weakness   Immunizations Administered     Name Date Dose VIS Date Route   Pfizer Covid-19 Vaccine Bivalent Booster 08/09/2021  2:18 PM 0.3 mL 05/29/2021 Intramuscular   Manufacturer: [employer], [employer]   Lot: [id number]   NDC: [phone]

## 2021-12-13 ENCOUNTER — Other Ambulatory Visit (HOSPITAL_COMMUNITY): Payer: Self-pay

## 2022-03-26 ENCOUNTER — Other Ambulatory Visit: Payer: Self-pay | Admitting: Obstetrics and Gynecology

## 2022-03-26 DIAGNOSIS — Z1231 Encounter for screening mammogram for malignant neoplasm of breast: Secondary | ICD-10-CM

## 2022-05-05 ENCOUNTER — Ambulatory Visit
Admission: RE | Admit: 2022-05-05 | Discharge: 2022-05-05 | Disposition: A | Discharge status: Home / Self Care | Payer: Managed Care, Other (non HMO) | Source: Ambulatory Visit | Attending: Obstetrics and Gynecology | Admitting: Obstetrics and Gynecology

## 2022-05-05 DIAGNOSIS — Z1231 Encounter for screening mammogram for malignant neoplasm of breast: Secondary | ICD-10-CM

## 2022-05-12 ENCOUNTER — Other Ambulatory Visit: Payer: Self-pay | Admitting: Obstetrics and Gynecology

## 2022-05-12 DIAGNOSIS — R928 Other abnormal and inconclusive findings on diagnostic imaging of breast: Secondary | ICD-10-CM

## 2022-05-29 ENCOUNTER — Ambulatory Visit
Admission: RE | Admit: 2022-05-29 | Discharge: 2022-05-29 | Disposition: A | Discharge status: Home / Self Care | Payer: Managed Care, Other (non HMO) | Source: Ambulatory Visit | Attending: Obstetrics and Gynecology | Admitting: Obstetrics and Gynecology

## 2022-05-29 ENCOUNTER — Other Ambulatory Visit: Payer: Self-pay | Admitting: Obstetrics and Gynecology

## 2022-05-29 DIAGNOSIS — R928 Other abnormal and inconclusive findings on diagnostic imaging of breast: Secondary | ICD-10-CM | POA: Insufficient documentation

## 2022-06-03 ENCOUNTER — Other Ambulatory Visit: Payer: Self-pay | Admitting: Obstetrics and Gynecology

## 2022-06-03 DIAGNOSIS — R928 Other abnormal and inconclusive findings on diagnostic imaging of breast: Secondary | ICD-10-CM

## 2022-06-12 ENCOUNTER — Ambulatory Visit
Admission: RE | Admit: 2022-06-12 | Discharge: 2022-06-12 | Disposition: A | Discharge status: Home / Self Care | Payer: Managed Care, Other (non HMO) | Source: Ambulatory Visit | Attending: Obstetrics and Gynecology | Admitting: Obstetrics and Gynecology

## 2022-06-12 DIAGNOSIS — R928 Other abnormal and inconclusive findings on diagnostic imaging of breast: Secondary | ICD-10-CM | POA: Insufficient documentation

## 2022-06-12 HISTORY — PX: BREAST BIOPSY: SHX20

## 2022-06-13 LAB — SURGICAL PATHOLOGY

## 2022-06-16 ENCOUNTER — Encounter: Payer: Self-pay | Admitting: *Deleted

## 2022-06-16 NOTE — Progress Notes (Signed)
Referral recieved from [facility] for benign breast mass.  Appointment scheduled for surgical consultation with Dr. Peyton Najjar at 10:30am on 07/01/22.   No further needs at this time.

## 2022-08-12 ENCOUNTER — Other Ambulatory Visit: Payer: Self-pay | Admitting: General Surgery

## 2022-08-12 DIAGNOSIS — R928 Other abnormal and inconclusive findings on diagnostic imaging of breast: Secondary | ICD-10-CM

## 2022-08-13 ENCOUNTER — Ambulatory Visit: Payer: Self-pay | Admitting: General Surgery

## 2022-08-13 NOTE — H&P (View-Only) (Signed)
PATIENT PROFILE: Jaclyn Rodriguez is a 51 y.o. female who presents to the Clinic for consultation at the request of Dr. Leafy Ro for evaluation of abnormal mammogram with distortion of the left breast.  PCP: Dion Body, MD  HISTORY OF PRESENT ILLNESS: Jaclyn Rodriguez reports she went to have her screening mammogram on 05/05/2022. She was found with a distortion of the left breast. This led to diagnostic mammogram which shows that the distortion is persistent on spot compression views. The patient denies any palpable masses, any skin changes, any nipple discharge or nipple retraction. The patient denies any family history of breast cancer.  The patient had a stereotactic core needle biopsy of the distortion. This shows benign breast tissue with stromal fibrosis, negative for atypia or malignancy. The was found discordant by radiologist.  I personally evaluated the images of the screening mammogram, diagnostic mammogram and biopsy mammogram and I agree with the radiologist with discordance of images with biopsy.  PROBLEM LIST: Problem List Date Reviewed: 06/03/2022   Noted  Osteopenia of multiple sites (Dexa 10/18/21 - repeat 2 yrs) 10/22/2021  CKD (chronic kidney disease) stage 3 (Cr 1 and GFR 58 - 05/27/22) 08/02/2021  Essential hypertension 05/13/2019  Borderline diabetes mellitus (A1c 6.3% - 05/27/22) - diet controlled 05/05/2018  Pelvic mass in female (confirmed on CT 05/19/17) - followed by Dr. Leafy Ro 05/19/2017  Mixed hyperlipidemia (LDL 104 - 05/27/22) 05/02/2016  Female infertility, age related 08/09/2014  Class 1 obesity due to excess calories with serious comorbidity and body mass index (BMI) of 33.0 to 33.9 in adult Unknown  Scoliosis of thoracic spine Unknown  Overview  mild    GENERAL REVIEW OF SYSTEMS:   General ROS: negative for - chills, fatigue, fever, weight gain or weight loss Allergy and Immunology ROS: negative for - hives  Hematological and Lymphatic ROS: negative for -  bleeding problems or bruising, negative for palpable nodes Endocrine ROS: negative for - heat or cold intolerance, hair changes Respiratory ROS: negative for - cough, shortness of breath or wheezing Cardiovascular ROS: no chest pain or palpitations GI ROS: negative for nausea, vomiting, abdominal pain, diarrhea, constipation Musculoskeletal ROS: negative for - joint swelling or muscle pain Neurological ROS: negative for - confusion, syncope Dermatological ROS: negative for pruritus and rash Psychiatric: negative for anxiety, depression, difficulty sleeping and memory loss  MEDICATIONS: Current Outpatient Medications  Medication Sig Dispense Refill  alendronate (FOSAMAX) 70 MG tablet Take 1 tablet (70 mg total) by mouth every 7 (seven) days Take with a full glass of water. Do not lie down for the next 30 min. 12 tablet 1  calcium carbonate-vitamin D3 (CALTRATE 600+D) 600 mg(1,533m) -400 unit tablet Take 1 tablet by mouth 2 (two) times daily with meals.  cyanocobalamin, vitamin B-12, 500 mcg Lozg Take by mouth. One daily  DOCOSAHEXANOIC ACID/EPA (FISH OIL ORAL) Take 1 capsule by mouth once daily.   ferrous sulfate 325 (65 FE) MG EC tablet Take 325 mg by mouth once daily as needed  folic acid (FOLVITE) 4993MCG tablet Take 400 mcg by mouth once daily.  hydroCHLOROthiazide (HYDRODIURIL) 12.5 MG tablet Take 1 tablet (12.5 mg total) by mouth once daily 90 tablet 1  multivitamin tablet Take 1 tablet by mouth once daily.  pravastatin (PRAVACHOL) 40 MG tablet Take 1 tablet (40 mg total) by mouth every evening 90 tablet 1   No current facility-administered medications for this visit.   ALLERGIES: Aspirin and Lisinopril  PAST MEDICAL HISTORY: Past Medical History:  Diagnosis Date  CKD (chronic kidney disease) stage 3 (Cr 1.1 and GFR 52 - 07/26/21) 08/02/2021  Essential hypertension 05/13/2019  Fibroid  Obesity  Pure hypercholesterolemia  Scoliosis of thoracic spine  mild   PAST SURGICAL  HISTORY: Past Surgical History:  Procedure Laterality Date  HYSTERECTOMY 06/27/2019  partial hysterectomy  COLONOSCOPY 10/24/2020  Diverticulosis/Otherwise normal colon/FHx CP/Repeat 4yr/[facility]  Right ankle surgery per Dr. TFredonia Highland(2015)  Right breast lumpectomy, 09/2000    FAMILY HISTORY: Family History  Problem Relation Age of Onset  Colon polyps Mother  removed 2020  Stroke Mother  Jan. 8, 2020  Heart disease Father  Stroke Father  No Known Problems Sister  No Known Problems Sister  Breast cancer Maternal Aunt  Father's younger sister    SOCIAL HISTORY: Social History   Socioeconomic History  Marital status: Married  Tobacco Use  Smoking status: Never  Passive exposure: Never  Smokeless tobacco: Never  Tobacco comments:  None  Vaping Use  Vaping Use: Never used  Substance and Sexual Activity  Alcohol use: No  Drug use: No  Sexual activity: Not Currently  Partners: Male  Birth control/protection: Surgical  Social History Narrative  Marital status- Recently marrried (11/2009)  Lives with husband (Harrie Jeans  Employment- [employer]  Exercise hx- Walk daily 45 min- 1 hr  Religious affliation- [employer]   PHYSICAL EXAM: Vitals:  07/01/22 1031  BP: 137/74  Pulse: 104   Body mass index is 33.44 kg/m. Weight: 80.3 kg (177 lb) (PER CHART)   GENERAL: Alert, active, oriented x3  HEENT: Pupils equal reactive to light. Extraocular movements are intact. Sclera clear. Palpebral conjunctiva normal red color.Pharynx clear.  NECK: Supple with no palpable mass and no adenopathy.  LUNGS: Sound clear with no rales rhonchi or wheezes.  HEART: Regular rhythm S1 and S2 without murmur.  BREAST: There is no palpable masses, skin changes, nipple retraction or nipple discharge. Benign breast physical exam bilaterally.  ABDOMEN: Soft and depressible, nontender with no palpable mass, no hepatomegaly.   EXTREMITIES:  Well-developed well-nourished symmetrical with no dependent edema.  NEUROLOGICAL: Awake alert oriented, facial expression symmetrical, moving all extremities.  REVIEW OF DATA: I have reviewed the following data today: Appointment on 05/27/2022  Component Date Value  Glucose 05/27/2022 113 (H)  Sodium 05/27/2022 142  Potassium 05/27/2022 3.6  Chloride 05/27/2022 103  Carbon Dioxide (CO2) 05/27/2022 33.7 (H)  Urea Nitrogen (BUN) 05/27/2022 12  Creatinine 05/27/2022 1.0  Glomerular Filtration Ra* 05/27/2022 58 (L)  Calcium 05/27/2022 9.4  AST 05/27/2022 15  ALT 05/27/2022 17  Alk Phos (alkaline Phosp* 05/27/2022 62  Albumin 05/27/2022 3.9  Bilirubin, Total 05/27/2022 0.4  Protein, Total 05/27/2022 6.8  A/G Ratio 05/27/2022 1.3  Hemoglobin A1C 05/27/2022 6.3 (H)  Average Blood Glucose (C* 05/27/2022 134  Cholesterol, Total 05/27/2022 182  Triglyceride 05/27/2022 138  HDL (High Density Lipopr* 05/27/2022 50.6  LDL Calculated 05/27/2022 104  VLDL Cholesterol 05/27/2022 28  Cholesterol/HDL Ratio 05/27/2022 3.6  WBC (White Blood Cell Co* 05/27/2022 9.2  RBC (Red Blood Cell Coun* 05/27/2022 4.78  Hemoglobin 05/27/2022 13.4  Hematocrit 05/27/2022 40.8  MCV (Mean Corpuscular Vo* 05/27/2022 85.4  MCH (Mean Corpuscular He* 05/27/2022 28.0  MCHC (Mean Corpuscular H* 05/27/2022 32.8  Platelet Count 05/27/2022 302  RDW-CV (Red Cell Distrib* 05/27/2022 14.0  MPV (Mean Platelet Volum* 05/27/2022 10.7  Neutrophils 05/27/2022 6.29  Lymphocytes 05/27/2022 1.90  Monocytes 05/27/2022 0.54  Eosinophils 05/27/2022 0.36  Basophils 05/27/2022 0.09  Neutrophil % 05/27/2022 68.2  Lymphocyte % 05/27/2022 20.6  Monocyte % 05/27/2022 5.9  Eosinophil % 05/27/2022 3.9  Basophil% 05/27/2022 1.0  Immature Granulocyte % 05/27/2022 0.4  Immature Granulocyte Cou* 05/27/2022 0.04    ASSESSMENT: Jaclyn Rodriguez is a 51 y.o. female presenting for consultation for distortion of the left breast.  Patient  was found with distortion of the left breast has persisted on diagnostic mammogram. She had a core needle biopsy that showed benign breast tissue with fibrosis. The was discordant with the images. She was recommended to have excisional biopsy.  I discussed with patient the recommendation of the excisional biopsy. I discussed with patient the purpose of the excisional biopsy including the need to rule out malignancy. Discussed with patient that this is a diagnostic purposes not therapeutic purposes. I discussed with patient the risk of the procedure include bleeding, infection, seromas, changes of sensation, pain, among others.   Abnormal finding on mammography [R92.8]  PLAN: 1. Left breast radiofrequency tagged excisional biopsy  Patient verbalized understanding, all questions were answered, and were agreeable with the plan outlined above.   Herbert Pun, MD

## 2022-08-13 NOTE — H&P (Signed)
PATIENT PROFILE: Jaclyn Rodriguez is a 51 y.o. female who presents to the Clinic for consultation at the request of Dr. Leafy Ro for evaluation of abnormal mammogram with distortion of the left breast.  PCP: Dion Body, MD  HISTORY OF PRESENT ILLNESS: Jaclyn Rodriguez reports she went to have her screening mammogram on 05/05/2022. She was found with a distortion of the left breast. This led to diagnostic mammogram which shows that the distortion is persistent on spot compression views. The patient denies any palpable masses, any skin changes, any nipple discharge or nipple retraction. The patient denies any family history of breast cancer.  The patient had a stereotactic core needle biopsy of the distortion. This shows benign breast tissue with stromal fibrosis, negative for atypia or malignancy. The was found discordant by radiologist.  I personally evaluated the images of the screening mammogram, diagnostic mammogram and biopsy mammogram and I agree with the radiologist with discordance of images with biopsy.  PROBLEM LIST: Problem List Date Reviewed: 06/03/2022   Noted  Osteopenia of multiple sites (Dexa 10/18/21 - repeat 2 yrs) 10/22/2021  CKD (chronic kidney disease) stage 3 (Cr 1 and GFR 58 - 05/27/22) 08/02/2021  Essential hypertension 05/13/2019  Borderline diabetes mellitus (A1c 6.3% - 05/27/22) - diet controlled 05/05/2018  Pelvic mass in female (confirmed on CT 05/19/17) - followed by Dr. Leafy Ro 05/19/2017  Mixed hyperlipidemia (LDL 104 - 05/27/22) 05/02/2016  Female infertility, age related 08/09/2014  Class 1 obesity due to excess calories with serious comorbidity and body mass index (BMI) of 33.0 to 33.9 in adult Unknown  Scoliosis of thoracic spine Unknown  Overview  mild    GENERAL REVIEW OF SYSTEMS:   General ROS: negative for - chills, fatigue, fever, weight gain or weight loss Allergy and Immunology ROS: negative for - hives  Hematological and Lymphatic ROS: negative for -  bleeding problems or bruising, negative for palpable nodes Endocrine ROS: negative for - heat or cold intolerance, hair changes Respiratory ROS: negative for - cough, shortness of breath or wheezing Cardiovascular ROS: no chest pain or palpitations GI ROS: negative for nausea, vomiting, abdominal pain, diarrhea, constipation Musculoskeletal ROS: negative for - joint swelling or muscle pain Neurological ROS: negative for - confusion, syncope Dermatological ROS: negative for pruritus and rash Psychiatric: negative for anxiety, depression, difficulty sleeping and memory loss  MEDICATIONS: Current Outpatient Medications  Medication Sig Dispense Refill  alendronate (FOSAMAX) 70 MG tablet Take 1 tablet (70 mg total) by mouth every 7 (seven) days Take with a full glass of water. Do not lie down for the next 30 min. 12 tablet 1  calcium carbonate-vitamin D3 (CALTRATE 600+D) 600 mg(1,510m) -400 unit tablet Take 1 tablet by mouth 2 (two) times daily with meals.  cyanocobalamin, vitamin B-12, 500 mcg Lozg Take by mouth. One daily  DOCOSAHEXANOIC ACID/EPA (FISH OIL ORAL) Take 1 capsule by mouth once daily.   ferrous sulfate 325 (65 FE) MG EC tablet Take 325 mg by mouth once daily as needed  folic acid (FOLVITE) 4937MCG tablet Take 400 mcg by mouth once daily.  hydroCHLOROthiazide (HYDRODIURIL) 12.5 MG tablet Take 1 tablet (12.5 mg total) by mouth once daily 90 tablet 1  multivitamin tablet Take 1 tablet by mouth once daily.  pravastatin (PRAVACHOL) 40 MG tablet Take 1 tablet (40 mg total) by mouth every evening 90 tablet 1   No current facility-administered medications for this visit.   ALLERGIES: Aspirin and Lisinopril  PAST MEDICAL HISTORY: Past Medical History:  Diagnosis Date  CKD (chronic kidney disease) stage 3 (Cr 1.1 and GFR 52 - 07/26/21) 08/02/2021  Essential hypertension 05/13/2019  Fibroid  Obesity  Pure hypercholesterolemia  Scoliosis of thoracic spine  mild   PAST SURGICAL  HISTORY: Past Surgical History:  Procedure Laterality Date  HYSTERECTOMY 06/27/2019  partial hysterectomy  COLONOSCOPY 10/24/2020  Diverticulosis/Otherwise normal colon/FHx CP/Repeat 78yr/S[facility]  Right ankle surgery per Dr. TFredonia Highland(2015)  Right breast lumpectomy, 09/2000    FAMILY HISTORY: Family History  Problem Relation Age of Onset  Colon polyps Mother  removed 2020  Stroke Mother  Jan. 8, 2020  Heart disease Father  Stroke Father  No Known Problems Sister  No Known Problems Sister  Breast cancer Maternal Aunt  Father's younger sister    SOCIAL HISTORY: Social History   Socioeconomic History  Marital status: Married  Tobacco Use  Smoking status: Never  Passive exposure: Never  Smokeless tobacco: Never  Tobacco comments:  None  Vaping Use  Vaping Use: Never used  Substance and Sexual Activity  Alcohol use: No  Drug use: No  Sexual activity: Not Currently  Partners: Male  Birth control/protection: Surgical  Social History Narrative  Marital status- Recently marrried (11/2009)  Lives with husband (Harrie Jeans  Employment- [employer]  Exercise hx- Walk daily 45 min- 1 hr  Religious affliation- [employer]   PHYSICAL EXAM: Vitals:  07/01/22 1031  BP: 137/74  Pulse: 104   Body mass index is 33.44 kg/m. Weight: 80.3 kg (177 lb) (PER CHART)   GENERAL: Alert, active, oriented x3  HEENT: Pupils equal reactive to light. Extraocular movements are intact. Sclera clear. Palpebral conjunctiva normal red color.Pharynx clear.  NECK: Supple with no palpable mass and no adenopathy.  LUNGS: Sound clear with no rales rhonchi or wheezes.  HEART: Regular rhythm S1 and S2 without murmur.  BREAST: There is no palpable masses, skin changes, nipple retraction or nipple discharge. Benign breast physical exam bilaterally.  ABDOMEN: Soft and depressible, nontender with no palpable mass, no hepatomegaly.   EXTREMITIES:  Well-developed well-nourished symmetrical with no dependent edema.  NEUROLOGICAL: Awake alert oriented, facial expression symmetrical, moving all extremities.  REVIEW OF DATA: I have reviewed the following data today: Appointment on 05/27/2022  Component Date Value  Glucose 05/27/2022 113 (H)  Sodium 05/27/2022 142  Potassium 05/27/2022 3.6  Chloride 05/27/2022 103  Carbon Dioxide (CO2) 05/27/2022 33.7 (H)  Urea Nitrogen (BUN) 05/27/2022 12  Creatinine 05/27/2022 1.0  Glomerular Filtration Ra* 05/27/2022 58 (L)  Calcium 05/27/2022 9.4  AST 05/27/2022 15  ALT 05/27/2022 17  Alk Phos (alkaline Phosp* 05/27/2022 62  Albumin 05/27/2022 3.9  Bilirubin, Total 05/27/2022 0.4  Protein, Total 05/27/2022 6.8  A/G Ratio 05/27/2022 1.3  Hemoglobin A1C 05/27/2022 6.3 (H)  Average Blood Glucose (C* 05/27/2022 134  Cholesterol, Total 05/27/2022 182  Triglyceride 05/27/2022 138  HDL (High Density Lipopr* 05/27/2022 50.6  LDL Calculated 05/27/2022 104  VLDL Cholesterol 05/27/2022 28  Cholesterol/HDL Ratio 05/27/2022 3.6  WBC (White Blood Cell Co* 05/27/2022 9.2  RBC (Red Blood Cell Coun* 05/27/2022 4.78  Hemoglobin 05/27/2022 13.4  Hematocrit 05/27/2022 40.8  MCV (Mean Corpuscular Vo* 05/27/2022 85.4  MCH (Mean Corpuscular He* 05/27/2022 28.0  MCHC (Mean Corpuscular H* 05/27/2022 32.8  Platelet Count 05/27/2022 302  RDW-CV (Red Cell Distrib* 05/27/2022 14.0  MPV (Mean Platelet Volum* 05/27/2022 10.7  Neutrophils 05/27/2022 6.29  Lymphocytes 05/27/2022 1.90  Monocytes 05/27/2022 0.54  Eosinophils 05/27/2022 0.36  Basophils 05/27/2022 0.09  Neutrophil % 05/27/2022 68.2  Lymphocyte % 05/27/2022 20.6  Monocyte % 05/27/2022 5.9  Eosinophil % 05/27/2022 3.9  Basophil% 05/27/2022 1.0  Immature Granulocyte % 05/27/2022 0.4  Immature Granulocyte Cou* 05/27/2022 0.04    ASSESSMENT: Ms. Lucchese is a 51 y.o. female presenting for consultation for distortion of the left breast.  Patient  was found with distortion of the left breast has persisted on diagnostic mammogram. She had a core needle biopsy that showed benign breast tissue with fibrosis. The was discordant with the images. She was recommended to have excisional biopsy.  I discussed with patient the recommendation of the excisional biopsy. I discussed with patient the purpose of the excisional biopsy including the need to rule out malignancy. Discussed with patient that this is a diagnostic purposes not therapeutic purposes. I discussed with patient the risk of the procedure include bleeding, infection, seromas, changes of sensation, pain, among others.   Abnormal finding on mammography [R92.8]  PLAN: 1. Left breast radiofrequency tagged excisional biopsy  Patient verbalized understanding, all questions were answered, and were agreeable with the plan outlined above.   Herbert Pun, MD

## 2022-08-25 ENCOUNTER — Encounter
Admission: RE | Admit: 2022-08-25 | Discharge: 2022-08-25 | Disposition: A | Discharge status: Home / Self Care | Payer: Managed Care, Other (non HMO) | Source: Ambulatory Visit | Attending: General Surgery | Admitting: General Surgery

## 2022-08-25 ENCOUNTER — Other Ambulatory Visit: Payer: Self-pay

## 2022-08-25 VITALS — Ht 61.0 in | Wt 170.0 lb

## 2022-08-25 DIAGNOSIS — I10 Essential (primary) hypertension: Secondary | ICD-10-CM

## 2022-08-25 HISTORY — DX: Hyperlipidemia, unspecified: E78.5

## 2022-08-25 HISTORY — DX: Chronic kidney disease, stage 3 unspecified: N18.30

## 2022-08-25 HISTORY — DX: Essential (primary) hypertension: I10

## 2022-08-25 HISTORY — DX: Prediabetes: R73.03

## 2022-08-25 NOTE — Patient Instructions (Addendum)
Your procedure is scheduled on: 09/01/22 Report to DAY SURGERY DEPARTMENT LOCATED ON 2ND FLOOR MEDICAL MALL ENTRANCE. To find out your arrival time please call [phone] between 1PM - 3PM on 08/29/22 .  Remember: Instructions that are not followed completely may result in serious medical risk, up to and including death, or upon the discretion of your surgeon and anesthesiologist your surgery may need to be rescheduled.     _X__ 1. Do not eat food or drink any liquids after midnight the night before your procedure.                 No gum chewing or hard candies.   __X__2.  On the morning of surgery brush your teeth with toothpaste and water, you                 may rinse your mouth with mouthwash if you wish.  Do not swallow any              toothpaste of mouthwash.     _X__ 3.  No Alcohol for 24 hours before or after surgery.   _X__ 4.  Do Not Smoke or use e-cigarettes For 24 Hours Prior to Your Surgery.                 Do not use any chewable tobacco products for at least 6 hours prior to                 surgery.  ____  5.  Bring all medications with you on the day of surgery if instructed.   __X__  6.  Notify your doctor if there is any change in your medical condition      (cold, fever, infections).     Do not wear jewelry, make-up, hairpins, clips or nail polish. Do not wear lotions, powders, or perfumes. NO DEODORANT Do not shave body hair hours prior to surgery. Men may shave face and neck. Do not bring valuables to the hospital.    [facility] is not responsible for any belongings or valuables.  Contacts, dentures/partials or body piercings may not be worn into surgery. Bring a case for your contacts, glasses or hearing aids, a denture cup will be supplied. Leave your suitcase in the car. After surgery it may be brought to your room. For patients admitted to the hospital, discharge time is determined by your treatment team.   Patients discharged the day of surgery will not  be allowed to drive home.   Please read over the following fact sheets that you were given:   CHG soap  __X__ Take these medicines the morning of surgery with A SIP OF WATER:    1. none  2.   3.   4.  5.  6.  ____ Fleet Enema (as directed)   __X__ Use CHG Soap/SAGE wipes as directed  ____ Use inhalers on the day of surgery  ____ Stop metformin/Janumet/Farxiga 2 days prior to surgery    ____ Take 1/2 of usual insulin dose the night before surgery. No insulin the morning          of surgery.   ____ Stop Blood Thinners Coumadin/Plavix/Xarelto/Pleta/Pradaxa/Eliquis/Effient/Aspirin  on   Or contact your Surgeon, Cardiologist or Medical Doctor regarding  ability to stop your blood thinners  __X__ Stop Anti-inflammatories 7 days before surgery such as Advil, Ibuprofen, Motrin,  BC or Goodies Powder, Naprosyn, Naproxen, Aleve, Aspirin    __X__ Stop all herbals and supplements, fish  oil or vitamins  until after surgery.    ____ Bring C-Pap to the hospital.   You may continue taking your Tylenol as needed

## 2022-08-26 ENCOUNTER — Encounter: Payer: Self-pay | Admitting: Urgent Care

## 2022-08-26 ENCOUNTER — Encounter
Admission: RE | Admit: 2022-08-26 | Discharge: 2022-08-26 | Discharge status: Home / Self Care | Payer: Managed Care, Other (non HMO) | Source: Ambulatory Visit | Attending: General Surgery

## 2022-08-26 DIAGNOSIS — I10 Essential (primary) hypertension: Secondary | ICD-10-CM | POA: Diagnosis present

## 2022-08-26 DIAGNOSIS — Z01810 Encounter for preprocedural cardiovascular examination: Secondary | ICD-10-CM

## 2022-08-28 ENCOUNTER — Ambulatory Visit
Admission: RE | Admit: 2022-08-28 | Discharge: 2022-08-28 | Disposition: A | Discharge status: Home / Self Care | Payer: Managed Care, Other (non HMO) | Source: Ambulatory Visit | Attending: General Surgery | Admitting: General Surgery

## 2022-08-28 DIAGNOSIS — R928 Other abnormal and inconclusive findings on diagnostic imaging of breast: Secondary | ICD-10-CM | POA: Insufficient documentation

## 2022-08-28 MED ORDER — LIDOCAINE HCL (PF) 1 % IJ SOLN
10.0000 mL | Freq: Once | INTRAMUSCULAR | Status: AC
  Administered 2022-08-28: 10 mL

## 2022-09-01 ENCOUNTER — Ambulatory Visit
Admission: RE | Admit: 2022-09-01 | Discharge: 2022-09-01 | Disposition: A | Discharge status: Home / Self Care | Payer: Managed Care, Other (non HMO) | Source: Ambulatory Visit | Attending: General Surgery | Admitting: General Surgery

## 2022-09-01 ENCOUNTER — Other Ambulatory Visit: Payer: Self-pay

## 2022-09-01 ENCOUNTER — Ambulatory Visit: Payer: Managed Care, Other (non HMO) | Admitting: General Practice

## 2022-09-01 ENCOUNTER — Encounter: Payer: Self-pay | Admitting: General Surgery

## 2022-09-01 ENCOUNTER — Ambulatory Visit
Admission: RE | Admit: 2022-09-01 | Discharge: 2022-09-01 | Disposition: A | Discharge status: Home / Self Care | Payer: Managed Care, Other (non HMO) | Attending: General Surgery | Admitting: General Surgery

## 2022-09-01 ENCOUNTER — Encounter
Admission: RE | Disposition: A | Discharge status: Home / Self Care | Payer: Self-pay | Source: Home / Self Care | Attending: General Surgery

## 2022-09-01 DIAGNOSIS — Z6833 Body mass index (BMI) 33.0-33.9, adult: Secondary | ICD-10-CM | POA: Insufficient documentation

## 2022-09-01 DIAGNOSIS — E669 Obesity, unspecified: Secondary | ICD-10-CM | POA: Diagnosis not present

## 2022-09-01 DIAGNOSIS — E785 Hyperlipidemia, unspecified: Secondary | ICD-10-CM | POA: Insufficient documentation

## 2022-09-01 DIAGNOSIS — N183 Chronic kidney disease, stage 3 unspecified: Secondary | ICD-10-CM | POA: Diagnosis not present

## 2022-09-01 DIAGNOSIS — R928 Other abnormal and inconclusive findings on diagnostic imaging of breast: Secondary | ICD-10-CM

## 2022-09-01 DIAGNOSIS — R7303 Prediabetes: Secondary | ICD-10-CM | POA: Insufficient documentation

## 2022-09-01 DIAGNOSIS — K219 Gastro-esophageal reflux disease without esophagitis: Secondary | ICD-10-CM | POA: Diagnosis not present

## 2022-09-01 DIAGNOSIS — N6022 Fibroadenosis of left breast: Secondary | ICD-10-CM | POA: Insufficient documentation

## 2022-09-01 DIAGNOSIS — I130 Hypertensive heart and chronic kidney disease with heart failure and stage 1 through stage 4 chronic kidney disease, or unspecified chronic kidney disease: Secondary | ICD-10-CM | POA: Diagnosis not present

## 2022-09-01 DIAGNOSIS — N649 Disorder of breast, unspecified: Secondary | ICD-10-CM | POA: Diagnosis present

## 2022-09-01 DIAGNOSIS — Z79899 Other long term (current) drug therapy: Secondary | ICD-10-CM | POA: Diagnosis not present

## 2022-09-01 HISTORY — PX: BREAST BIOPSY WITH RADIO FREQUENCY LOCALIZER: SHX6895

## 2022-09-01 SURGERY — BREAST BIOPSY WITH RADIO FREQUENCY LOCALIZER
Anesthesia: General | Laterality: Left

## 2022-09-01 MED ORDER — CEFAZOLIN SODIUM-DEXTROSE 2-4 GM/100ML-% IV SOLN
INTRAVENOUS | Status: AC
  Filled 2022-09-01: qty 100

## 2022-09-01 MED ORDER — LACTATED RINGERS IV SOLN: INTRAVENOUS | Status: DC

## 2022-09-01 MED ORDER — PROPOFOL 10 MG/ML IV BOLUS
INTRAVENOUS | Status: AC
  Filled 2022-09-01: qty 20

## 2022-09-01 MED ORDER — FENTANYL CITRATE (PF) 100 MCG/2ML IJ SOLN: 25.0000 ug | INTRAMUSCULAR | Status: DC | PRN

## 2022-09-01 MED ORDER — OXYCODONE HCL 5 MG PO TABS
5.0000 mg | ORAL_TABLET | Freq: Once | ORAL | Status: AC | PRN
  Administered 2022-09-01: 5 mg via ORAL

## 2022-09-01 MED ORDER — CHLORHEXIDINE GLUCONATE 0.12 % MT SOLN: 15.0000 mL | Freq: Once | OROMUCOSAL | Status: AC

## 2022-09-01 MED ORDER — ONDANSETRON HCL 4 MG/2ML IJ SOLN
INTRAMUSCULAR | Status: AC
  Filled 2022-09-01: qty 2

## 2022-09-01 MED ORDER — ACETAMINOPHEN 10 MG/ML IV SOLN
INTRAVENOUS | Status: AC
  Filled 2022-09-01: qty 100

## 2022-09-01 MED ORDER — FAMOTIDINE 20 MG PO TABS
ORAL_TABLET | ORAL | Status: AC
  Administered 2022-09-01: 20 mg via ORAL
  Filled 2022-09-01: qty 1

## 2022-09-01 MED ORDER — PROPOFOL 10 MG/ML IV BOLUS
INTRAVENOUS | Status: DC | PRN
  Administered 2022-09-01: 150 mg via INTRAVENOUS

## 2022-09-01 MED ORDER — HYDROCODONE-ACETAMINOPHEN 5-325 MG PO TABS: 1.0000 | ORAL_TABLET | ORAL | 0 refills | Status: AC | PRN

## 2022-09-01 MED ORDER — KETAMINE HCL 50 MG/5ML IJ SOSY
PREFILLED_SYRINGE | INTRAMUSCULAR | Status: AC
  Filled 2022-09-01: qty 5

## 2022-09-01 MED ORDER — PHENYLEPHRINE HCL (PRESSORS) 10 MG/ML IV SOLN
INTRAVENOUS | Status: DC | PRN
  Administered 2022-09-01 (×3): 80 ug via INTRAVENOUS

## 2022-09-01 MED ORDER — FENTANYL CITRATE (PF) 100 MCG/2ML IJ SOLN
INTRAMUSCULAR | Status: AC
  Filled 2022-09-01: qty 2

## 2022-09-01 MED ORDER — OXYCODONE HCL 5 MG PO TABS
ORAL_TABLET | ORAL | Status: AC
  Filled 2022-09-01: qty 1

## 2022-09-01 MED ORDER — ACETAMINOPHEN 10 MG/ML IV SOLN
INTRAVENOUS | Status: DC | PRN
  Administered 2022-09-01: 1000 mg via INTRAVENOUS

## 2022-09-01 MED ORDER — CHLORHEXIDINE GLUCONATE 0.12 % MT SOLN
OROMUCOSAL | Status: AC
  Administered 2022-09-01: 15 mL via OROMUCOSAL
  Filled 2022-09-01: qty 15

## 2022-09-01 MED ORDER — DEXAMETHASONE SODIUM PHOSPHATE 10 MG/ML IJ SOLN
INTRAMUSCULAR | Status: DC | PRN
  Administered 2022-09-01: 10 mg via INTRAVENOUS

## 2022-09-01 MED ORDER — BUPIVACAINE-EPINEPHRINE (PF) 0.5% -1:200000 IJ SOLN
INTRAMUSCULAR | Status: DC | PRN
  Administered 2022-09-01: 20 mL

## 2022-09-01 MED ORDER — PHENYLEPHRINE 80 MCG/ML (10ML) SYRINGE FOR IV PUSH (FOR BLOOD PRESSURE SUPPORT)
PREFILLED_SYRINGE | INTRAVENOUS | Status: AC
  Filled 2022-09-01: qty 10

## 2022-09-01 MED ORDER — OXYCODONE HCL 5 MG/5ML PO SOLN: 5.0000 mg | Freq: Once | ORAL | Status: AC | PRN

## 2022-09-01 MED ORDER — LIDOCAINE HCL (PF) 2 % IJ SOLN
INTRAMUSCULAR | Status: AC
  Filled 2022-09-01: qty 5

## 2022-09-01 MED ORDER — FENTANYL CITRATE (PF) 100 MCG/2ML IJ SOLN
INTRAMUSCULAR | Status: DC | PRN
  Administered 2022-09-01 (×2): 25 ug via INTRAVENOUS

## 2022-09-01 MED ORDER — MIDAZOLAM HCL 2 MG/2ML IJ SOLN
INTRAMUSCULAR | Status: AC
  Filled 2022-09-01: qty 2

## 2022-09-01 MED ORDER — MIDAZOLAM HCL 2 MG/2ML IJ SOLN
INTRAMUSCULAR | Status: DC | PRN
  Administered 2022-09-01: 2 mg via INTRAVENOUS

## 2022-09-01 MED ORDER — ORAL CARE MOUTH RINSE: 15.0000 mL | Freq: Once | OROMUCOSAL | Status: AC

## 2022-09-01 MED ORDER — ONDANSETRON HCL 4 MG/2ML IJ SOLN
INTRAMUSCULAR | Status: DC | PRN
  Administered 2022-09-01: 4 mg via INTRAVENOUS

## 2022-09-01 MED ORDER — FAMOTIDINE 20 MG PO TABS: 20.0000 mg | ORAL_TABLET | Freq: Once | ORAL | Status: AC

## 2022-09-01 MED ORDER — BUPIVACAINE-EPINEPHRINE (PF) 0.5% -1:200000 IJ SOLN
INTRAMUSCULAR | Status: AC
  Filled 2022-09-01: qty 30

## 2022-09-01 MED ORDER — LIDOCAINE HCL (CARDIAC) PF 100 MG/5ML IV SOSY
PREFILLED_SYRINGE | INTRAVENOUS | Status: DC | PRN
  Administered 2022-09-01: 100 mg via INTRAVENOUS

## 2022-09-01 MED ORDER — KETAMINE HCL 10 MG/ML IJ SOLN
INTRAMUSCULAR | Status: DC | PRN
  Administered 2022-09-01: 25 mg via INTRAVENOUS

## 2022-09-01 MED ORDER — HYDROCODONE-ACETAMINOPHEN 5-325 MG PO TABS
ORAL_TABLET | ORAL | Status: AC
  Filled 2022-09-01: qty 1

## 2022-09-01 MED ORDER — CEFAZOLIN SODIUM-DEXTROSE 2-4 GM/100ML-% IV SOLN
2.0000 g | INTRAVENOUS | Status: AC
  Administered 2022-09-01: 2 g via INTRAVENOUS

## 2022-09-01 MED ORDER — STERILE WATER FOR IRRIGATION IR SOLN
Status: DC | PRN
  Administered 2022-09-01: 500 mL

## 2022-09-01 SURGICAL SUPPLY — 43 items
ADH SKN CLS APL DERMABOND .7 (GAUZE/BANDAGES/DRESSINGS) ×1
APL PRP STRL LF DISP 70% ISPRP (MISCELLANEOUS) ×1
BLADE SURG 15 STRL LF DISP TIS (BLADE) ×1 IMPLANT
BLADE SURG 15 STRL SS (BLADE) ×1
CHLORAPREP W/TINT 26 (MISCELLANEOUS) ×1 IMPLANT
CNTNR SPEC 2.5X3XGRAD LEK (MISCELLANEOUS) ×1
CONT SPEC 4OZ STRL OR WHT (MISCELLANEOUS) ×1
CONTAINER SPEC 2.5X3XGRAD LEK (MISCELLANEOUS) ×1 IMPLANT
COVER PROBE ULTRASOUND 5X96 (MISCELLANEOUS) IMPLANT
DERMABOND ADVANCED .7 DNX12 (GAUZE/BANDAGES/DRESSINGS) ×1 IMPLANT
DEVICE DUBIN SPECIMEN MAMMOGRA (MISCELLANEOUS) ×1 IMPLANT
DRAPE LAPAROTOMY TRNSV 106X77 (MISCELLANEOUS) ×1 IMPLANT
ELECT CAUTERY BLADE TIP 2.5 (TIP) ×1
ELECT REM PT RETURN 9FT ADLT (ELECTROSURGICAL) ×1
ELECTRODE CAUTERY BLDE TIP 2.5 (TIP) ×1 IMPLANT
ELECTRODE REM PT RTRN 9FT ADLT (ELECTROSURGICAL) ×1 IMPLANT
GAUZE 4X4 16PLY ~~LOC~~+RFID DBL (SPONGE) ×1 IMPLANT
GLOVE BIO SURGEON STRL SZ 6.5 (GLOVE) ×1 IMPLANT
GLOVE BIOGEL PI IND STRL 6.5 (GLOVE) ×1 IMPLANT
GOWN STRL REUS W/ TWL LRG LVL3 (GOWN DISPOSABLE) ×3 IMPLANT
GOWN STRL REUS W/TWL LRG LVL3 (GOWN DISPOSABLE) ×3
KIT MARKER MARGIN INK (KITS) IMPLANT
KIT TURNOVER KIT A (KITS) ×1 IMPLANT
LABEL OR SOLS (LABEL) ×1 IMPLANT
MANIFOLD NEPTUNE II (INSTRUMENTS) ×1 IMPLANT
MARKER MARGIN CORRECT CLIP (MARKER) IMPLANT
NDL HYPO 25X1 1.5 SAFETY (NEEDLE) ×1 IMPLANT
NEEDLE HYPO 25X1 1.5 SAFETY (NEEDLE) ×1 IMPLANT
PACK BASIN MINOR ARMC (MISCELLANEOUS) ×1 IMPLANT
RETRACTOR RING XSMALL (MISCELLANEOUS) IMPLANT
RTRCTR WOUND ALEXIS 13CM XS SH (MISCELLANEOUS) ×1
SET LOCALIZER 20 PROBE US (MISCELLANEOUS) ×1 IMPLANT
SUT MNCRL 4-0 (SUTURE) ×1
SUT MNCRL 4-0 27XMFL (SUTURE) ×1
SUT SILK 2 0 SH (SUTURE) ×1 IMPLANT
SUT VIC AB 3-0 SH 27 (SUTURE) ×1
SUT VIC AB 3-0 SH 27X BRD (SUTURE) ×1 IMPLANT
SUTURE MNCRL 4-0 27XMF (SUTURE) ×1 IMPLANT
SYR 10ML LL (SYRINGE) ×1 IMPLANT
SYR BULB IRRIG 60ML STRL (SYRINGE) ×1 IMPLANT
TRAP FLUID SMOKE EVACUATOR (MISCELLANEOUS) ×1 IMPLANT
TRAP NEPTUNE SPECIMEN COLLECT (MISCELLANEOUS) ×1 IMPLANT
WATER STERILE IRR 500ML POUR (IV SOLUTION) ×1 IMPLANT

## 2022-09-01 NOTE — Op Note (Signed)
Preoperative diagnosis: Left breast distortion.  Postoperative diagnosis: Same.   Procedure: Left radiofrequency tag-localized excisional biopsy.                      Anesthesia: GETA  Surgeon: Dr. Hazle Quant  Wound Classification: Clean  Indications: Patient is a 51 y.o. female with a nonpalpable left breast distortion noted on mammography with core biopsy demonstrating benign mammary tissue that was discordant with images and requires radiofrequency tag-localized excisional biopsy to rule out malignancy.   Findings: 1. Specimen mammography shows marker and tag on specimen 2. No other palpable mass or lymph node identified.   Description of procedure: Preoperative radiofrequency tag localization was performed by radiology. The patient was taken to the operating room and placed supine on the operating table, and after general anesthesia the left chest was prepped and draped in the usual sterile fashion. A time-out was completed verifying correct patient, procedure, site, positioning, and implant(s) and/or special equipment prior to beginning this procedure.  By comparing the localization studies and interrogation with Localizer device, the probable trajectory and location of the mass was visualized. A circumareolar skin incision was planned in such a way as to minimize the amount of dissection to reach the mass.  The skin incision was made. Flaps were raised and the location of the tag was confirmed with Localizer device confirmed. A 2-0 silk figure-of-eight stay suture was placed and used for retraction. Dissection was then taken down circumferentially, taking care to include the entire localizing tag and a wide margin of grossly normal tissue. The specimen and entire localizing tag were removed. The specimen was oriented and sent to radiology with the localization studies. Confirmation was received that the entire target lesion had been resected. The wound was irrigated. Hemostasis was  checked. The wound was closed with interrupted sutures of 3-0 Vicryl and a subcuticular suture of Monocryl 3-0. No attempt was made to close the dead space.   Specimen: Left excisional biopsy                     Sentinel Lymph nodes  Complications: None  Estimated Blood Loss: 5 mL

## 2022-09-01 NOTE — Addendum Note (Signed)
Addendum  created 09/01/22 1546 by Knowledge Escandon, CRNA   Flowsheet accepted    

## 2022-09-01 NOTE — Anesthesia Procedure Notes (Signed)
Procedure Name: LMA Insertion Date/Time: 09/01/2022 7:35 AM  Performed by: Juan Kissoon, [place], CRNAPre-anesthesia Checklist: Patient identified, Patient being monitored, Timeout performed, Emergency Drugs available and Suction available Patient Re-evaluated:Patient Re-evaluated prior to induction Oxygen Delivery Method: Circle system utilized Preoxygenation: Pre-oxygenation with 100% oxygen Induction Type: IV induction Ventilation: Mask ventilation without difficulty LMA: LMA inserted LMA Size: 4.0 Tube type: Oral Number of attempts: 1 Placement Confirmation: positive ETCO2 and breath sounds checked- equal and bilateral Tube secured with: Tape Dental Injury: Teeth and Oropharynx as per pre-operative assessment

## 2022-09-01 NOTE — Interval H&P Note (Signed)
History and Physical Interval Note:  09/01/2022 6:49 AM  Jaclyn Rodriguez  has presented today for surgery, with the diagnosis of R92.8 abnormal finding on mammography.  The various methods of treatment have been discussed with the patient and family. After consideration of risks, benefits and other options for treatment, the patient has consented to  Procedure(s): BREAST BIOPSY WITH RADIO FREQUENCY LOCALIZER (Left) as a surgical intervention.  The patient's history has been reviewed, patient examined, no change in status, stable for surgery.  I have reviewed the patient's chart and labs.  Questions were answered to the patient's satisfaction.     Carolan Shiver

## 2022-09-01 NOTE — Anesthesia Preprocedure Evaluation (Addendum)
Anesthesia Evaluation  Patient identified by MRN, date of birth, ID band Patient awake    Reviewed: Allergy & Precautions, NPO status , Patient's Chart, lab work & pertinent test results  History of Anesthesia Complications Negative for: history of anesthetic complications  Airway Mallampati: III  TM Distance: >3 FB Neck ROM: full    Dental  (+) Dental Advidsory Given, Teeth Intact   Pulmonary neg pulmonary ROS, neg COPD   Pulmonary exam normal        Cardiovascular hypertension, (-) Past MI and (-) CABG Normal cardiovascular exam     Neuro/Psych negative neurological ROS  negative psych ROS   GI/Hepatic Neg liver ROS,GERD  Medicated and Controlled,,  Endo/Other  negative endocrine ROS    Renal/GU      Musculoskeletal   Abdominal   Peds  Hematology negative hematology ROS (+)   Anesthesia Other Findings Past Medical History: No date: CKD (chronic kidney disease) stage 3, GFR 30-59 ml/min (HCC) No date: HLD (hyperlipidemia) No date: Hypertension No date: Pre-diabetes  Past Surgical History: No date: ABDOMINAL HYSTERECTOMY 06/12/2022: BREAST BIOPSY; Left     Comment:  stereo bx distortion, coil marker, path pending 2001: BREAST EXCISIONAL BIOPSY; Right     Comment:  Negative - Excisional 2001: BREAST SURGERY     Comment:  rt lumpectomy-neg 12/08/2013: ORIF ANKLE FRACTURE; Right     Comment:  Procedure: RIGHT ANKLE: FRACTURE OPEN TREATMENT               TRIMALLEOLARD ANKLE INCLUDES INTERNAL FIXATION WITHOUT               FIXATION OF POSTERIOR LIP, FRACTURE OPEN TREATMENT DISTAL              TIBIOFIBULAR INCLUDES INTERNAL FIXATION;  Surgeon:               Sheral Apley, MD;  Location: [facility];  Service: Orthopedics;  Laterality: Right; No date: WISDOM TOOTH EXTRACTION  BMI    Body Mass Index: 33.07 kg/m      Reproductive/Obstetrics negative OB ROS                              Anesthesia Physical Anesthesia Plan  ASA: 2  Anesthesia Plan: General/Spinal   Post-op Pain Management:    Induction: Intravenous  PONV Risk Score and Plan: 3 and Dexamethasone, Ondansetron and Midazolam  Airway Management Planned: LMA  Additional Equipment:   Intra-op Plan:   Post-operative Plan: Extubation in OR  Informed Consent: I have reviewed the patients History and Physical, chart, labs and discussed the procedure including the risks, benefits and alternatives for the proposed anesthesia with the patient or authorized representative who has indicated his/her understanding and acceptance.     Dental Advisory Given  Plan Discussed with: Anesthesiologist, CRNA and Surgeon  Anesthesia Plan Comments: (Patient consented for risks of anesthesia including but not limited to:  - adverse reactions to medications - damage to eyes, teeth, lips or other oral mucosa - nerve damage due to positioning  - sore throat or hoarseness - Damage to heart, brain, nerves, lungs, other parts of body or loss of life  Patient voiced understanding.)       Anesthesia Quick Evaluation

## 2022-09-01 NOTE — Discharge Instructions (Addendum)
  Diet: Resume home heart healthy regular diet.   Activity: Increase activity as tolerated. Light activity and walking are encouraged. Do not drive or drink alcohol if taking narcotic pain medications.  Wound care: May shower with soapy water and pat dry (do not rub incisions), but no baths or submerging incision underwater until follow-up. (no swimming)   Medications: Resume all home medications. For mild to moderate pain: acetaminophen (Tylenol) or ibuprofen (if no kidney disease). Combining Tylenol with alcohol can substantially increase your risk of causing liver disease. Narcotic pain medications, if prescribed, can be used for severe pain, though may cause nausea, constipation, and drowsiness. Do not combine Tylenol and Norco within a 6 hour period as Norco contains Tylenol. If you do not need the narcotic pain medication, you do not need to fill the prescription.  Call office ([phone]) at any time if any questions, worsening pain, fevers/chills, bleeding, drainage from incision site, or other concerns.   AMBULATORY SURGERY  DISCHARGE INSTRUCTIONS   The drugs that you were given will stay in your system until tomorrow so for the next 24 hours you should not:  Drive an automobile Make any legal decisions Drink any alcoholic beverage   You may resume regular meals tomorrow.  Today it is better to start with liquids and gradually work up to solid foods.  You may eat anything you prefer, but it is better to start with liquids, then soup and crackers, and gradually work up to solid foods.   Please notify your doctor immediately if you have any unusual bleeding, trouble breathing, redness and pain at the surgery site, drainage, fever, or pain not relieved by medication.    Additional Instructions:        Please contact your physician with any problems or Same Day Surgery at [phone], Monday through Friday 6 am to 4 pm, or [place] at  Main number at  [phone]. 

## 2022-09-01 NOTE — Anesthesia Postprocedure Evaluation (Signed)
Anesthesia Post Note  Patient: Jaclyn Rodriguez  Procedure(s) Performed: BREAST BIOPSY WITH RADIO FREQUENCY LOCALIZER (Left)  Patient location during evaluation: PACU Anesthesia Type: General Level of consciousness: awake and alert Pain management: pain level controlled Vital Signs Assessment: post-procedure vital signs reviewed and stable Respiratory status: spontaneous breathing, nonlabored ventilation, respiratory function stable and patient connected to nasal cannula oxygen Cardiovascular status: blood pressure returned to baseline and stable Postop Assessment: no apparent nausea or vomiting Anesthetic complications: no   No notable events documented.   Last Vitals:  Vitals:   09/01/22 0945 09/01/22 1009  BP: 110/67 114/66  Pulse: 85 80  Resp: 15 14  Temp: (!) 36.3 C 36.6 C  SpO2: 97% 99%    Last Pain:  Vitals:   09/01/22 1009  TempSrc: Temporal  PainSc: 4                  Corinda Gubler

## 2022-09-01 NOTE — Transfer of Care (Signed)
Immediate Anesthesia Transfer of Care Note  Patient: Jaclyn Rodriguez  Procedure(s) Performed: BREAST BIOPSY WITH RADIO FREQUENCY LOCALIZER (Left)  Patient Location: PACU  Anesthesia Type:General  Level of Consciousness: awake, alert , and oriented  Airway & Oxygen Therapy: Patient Spontanous Breathing  Post-op Assessment: Report given to RN and Post -op Vital signs reviewed and stable  Post vital signs: Reviewed and stable  Last Vitals:  Vitals Value Taken Time  BP 88/50 09/01/22 0845  Temp 35.7 C 09/01/22 0842  Pulse 72 09/01/22 0847  Resp 11 09/01/22 0847  SpO2 99 % 09/01/22 0847  Vitals shown include unvalidated device data.  Last Pain:  Vitals:   09/01/22 0842  TempSrc:   PainSc: 0-No pain         Complications: No notable events documented.

## 2022-09-02 ENCOUNTER — Encounter: Payer: Self-pay | Admitting: General Surgery

## 2022-09-09 LAB — SURGICAL PATHOLOGY

## 2022-12-01 ENCOUNTER — Other Ambulatory Visit: Payer: Self-pay | Admitting: General Surgery

## 2022-12-01 DIAGNOSIS — R928 Other abnormal and inconclusive findings on diagnostic imaging of breast: Secondary | ICD-10-CM

## 2023-01-12 ENCOUNTER — Ambulatory Visit
Admission: RE | Admit: 2023-01-12 | Discharge: 2023-01-12 | Disposition: A | Discharge status: Home / Self Care | Payer: Managed Care, Other (non HMO) | Source: Ambulatory Visit | Attending: General Surgery

## 2023-01-12 ENCOUNTER — Ambulatory Visit
Admission: RE | Admit: 2023-01-12 | Discharge: 2023-01-12 | Disposition: A | Discharge status: Home / Self Care | Payer: Managed Care, Other (non HMO) | Source: Ambulatory Visit | Attending: General Surgery | Admitting: General Surgery

## 2023-01-12 DIAGNOSIS — R928 Other abnormal and inconclusive findings on diagnostic imaging of breast: Secondary | ICD-10-CM

## 2023-01-30 IMAGING — MG MM DIGITAL SCREENING BILAT W/ TOMO AND CAD
6 of 10 series · 6 of 30 positions shown · non-contrast
Comparison: Previous exam(s).

CLINICAL DATA: Screening.

EXAM:
DIGITAL SCREENING BILATERAL MAMMOGRAM WITH TOMOSYNTHESIS AND CAD
TECHNIQUE: Bilateral screening digital craniocaudal and mediolateral oblique
mammograms were obtained. Bilateral screening digital breast
tomosynthesis was performed. The images were evaluated with
computer-aided detection.

[R CC synth-2D]
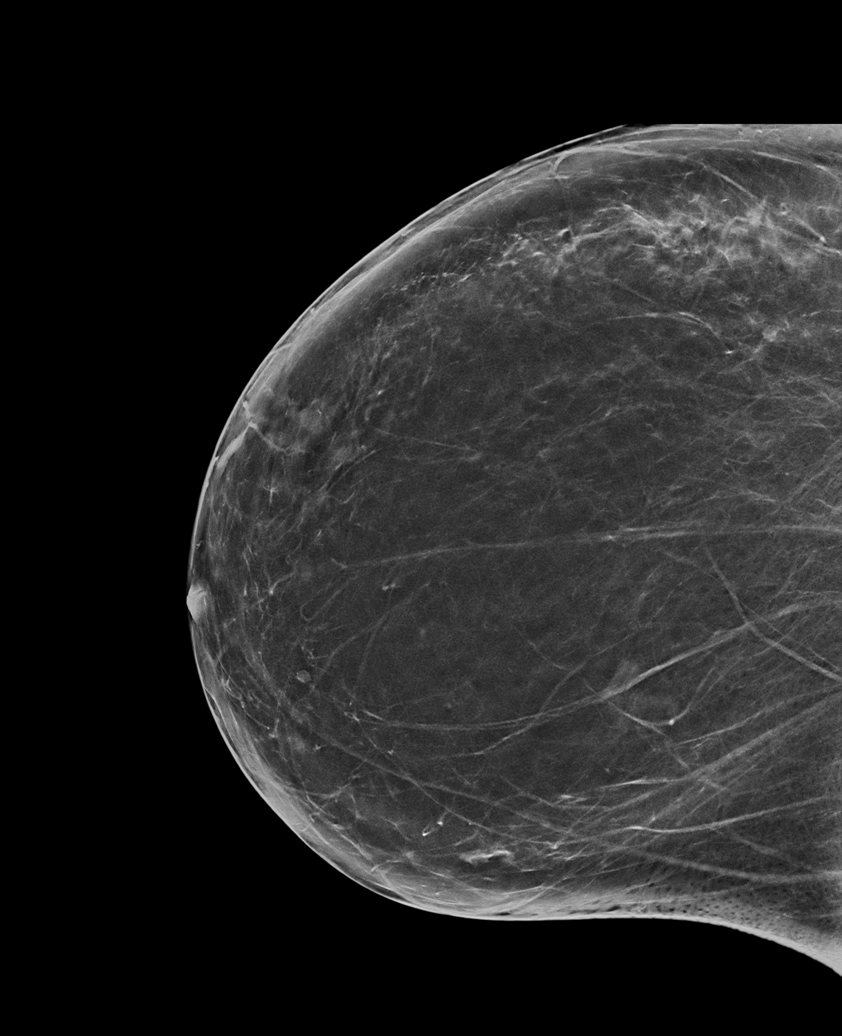

[L MLO synth-2D (1 of 2)]
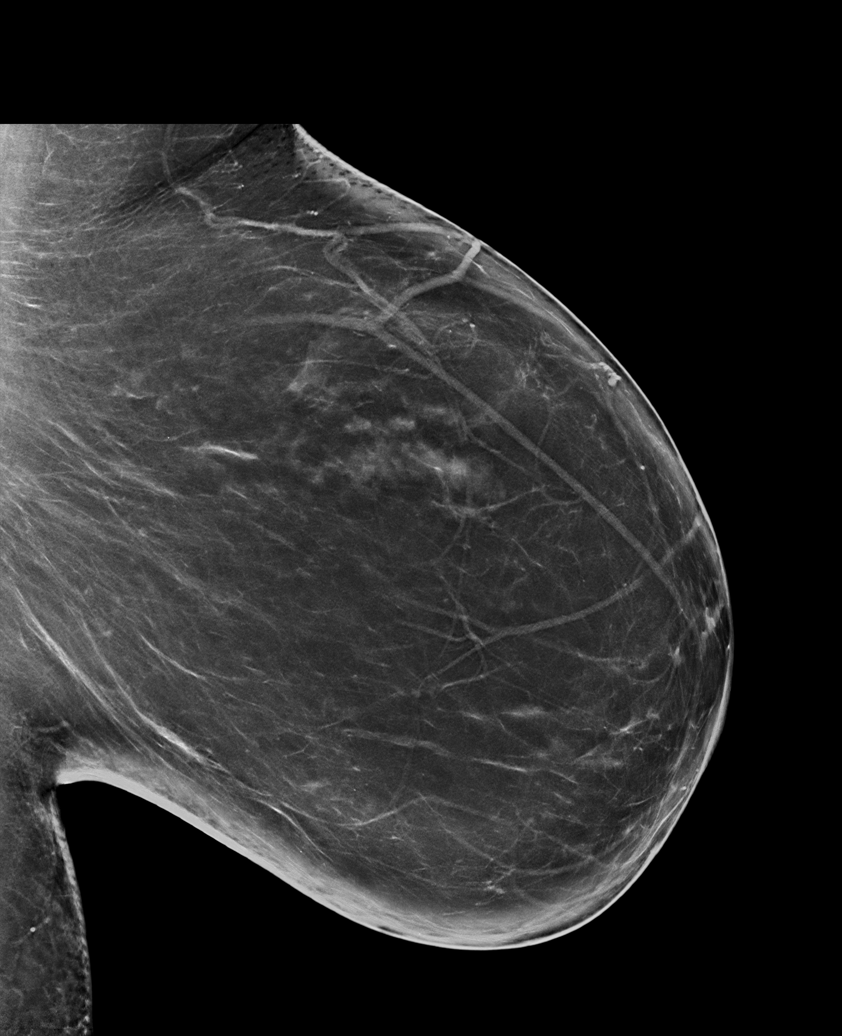

[L CC synth-2D]
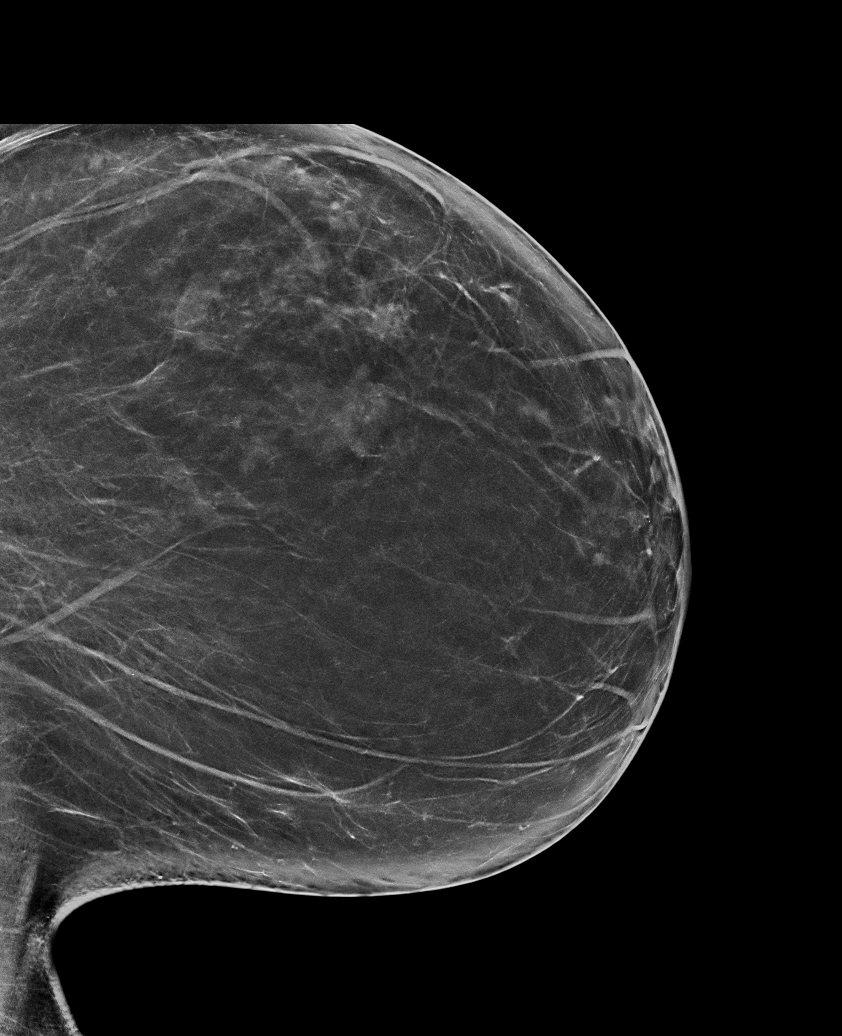

[L MLO synth-2D (2 of 2)]
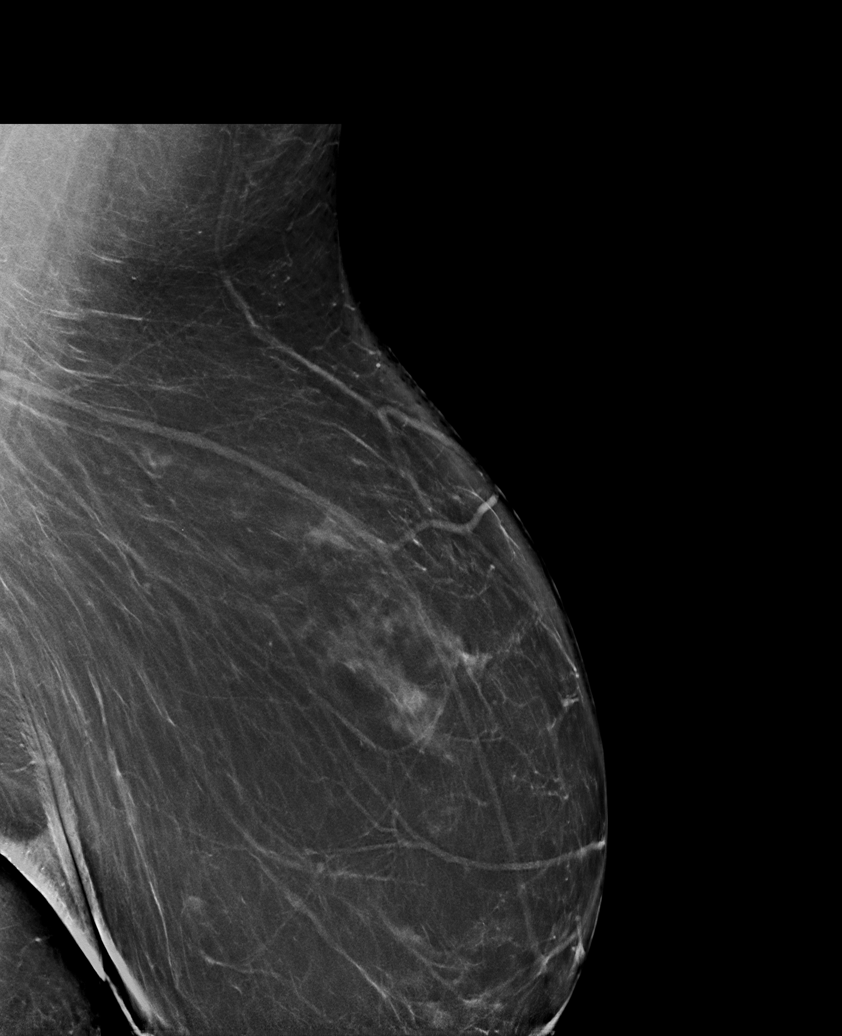

[R MLO synth-2D]
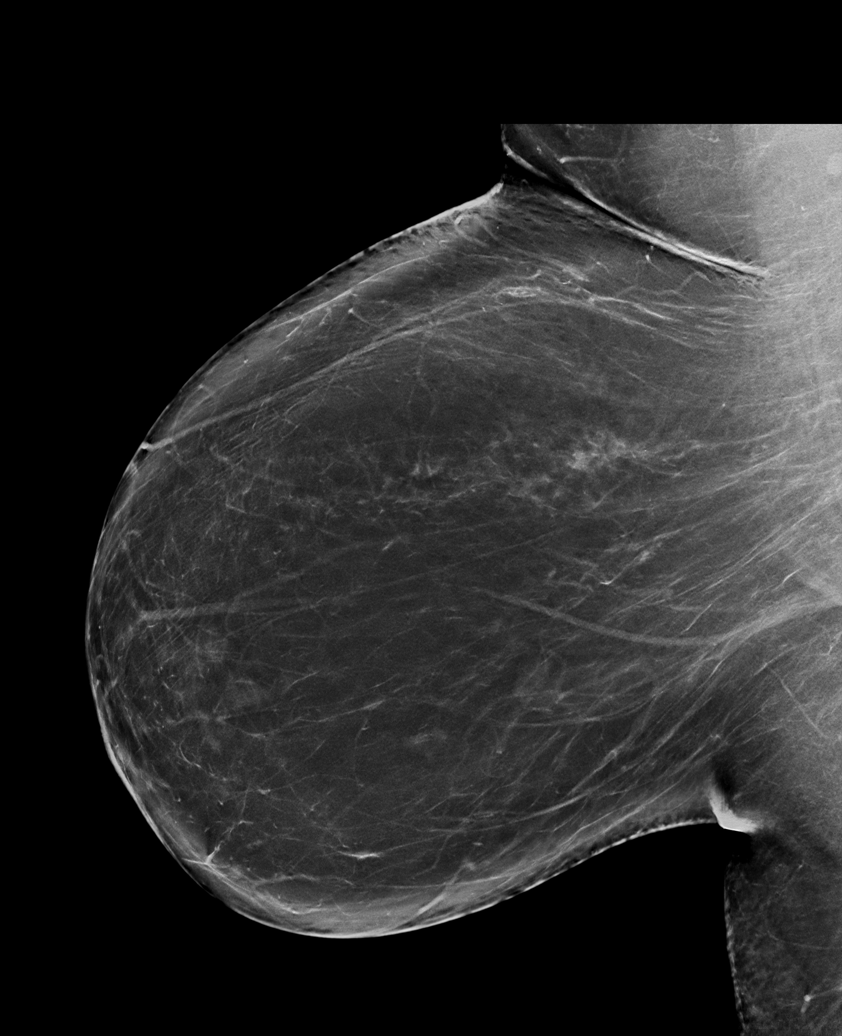

[L MLO tomo · tomo slice 49/97.0]
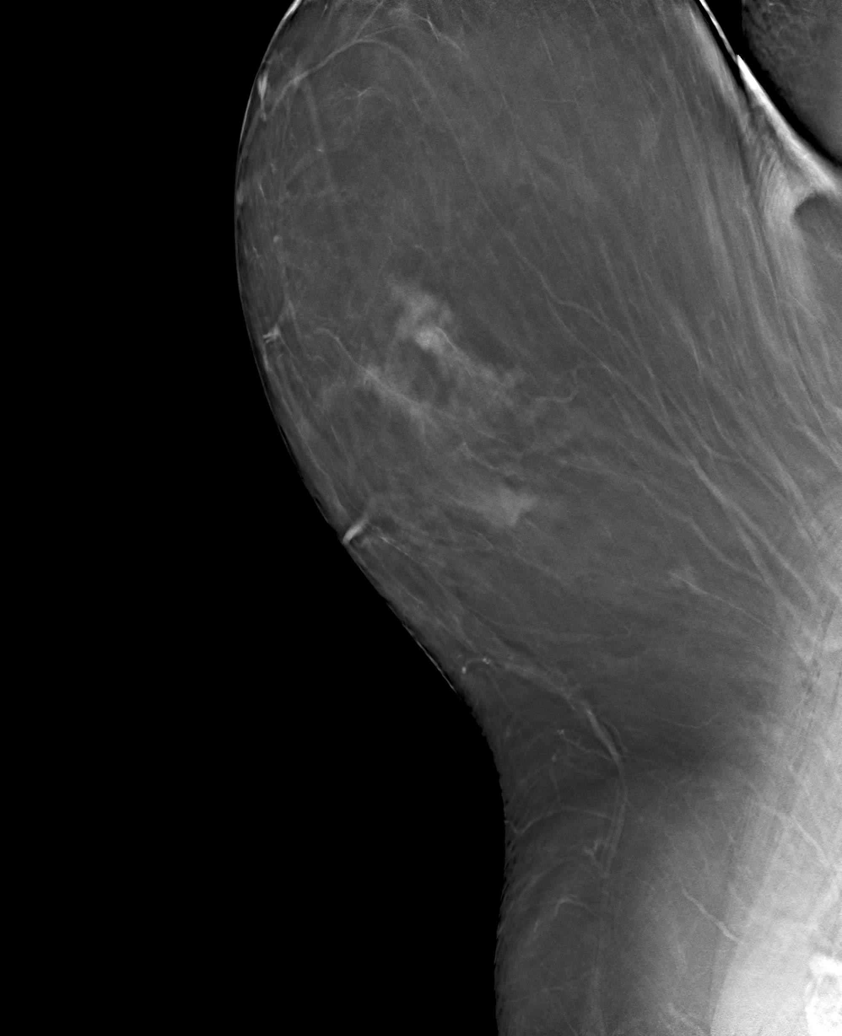

[6 of 30 positions shown; findings below may reference images not displayed]

ACR Breast Density Category b: There are scattered areas of
fibroglandular density.
FINDINGS: There are no findings suspicious for malignancy.
IMPRESSION: No mammographic evidence of malignancy. A result letter of this
screening mammogram will be mailed directly to the patient.

RECOMMENDATION:
Screening mammogram in one year. (Code:51-O-LD2)

BI-RADS CATEGORY  1: Negative.

## 2023-04-07 ENCOUNTER — Other Ambulatory Visit: Payer: Self-pay | Admitting: Obstetrics and Gynecology

## 2023-04-07 DIAGNOSIS — Z1231 Encounter for screening mammogram for malignant neoplasm of breast: Secondary | ICD-10-CM

## 2023-05-07 ENCOUNTER — Ambulatory Visit
Admission: RE | Admit: 2023-05-07 | Discharge: 2023-05-07 | Disposition: A | Discharge status: Home / Self Care | Payer: Managed Care, Other (non HMO) | Source: Ambulatory Visit | Attending: Obstetrics and Gynecology | Admitting: Obstetrics and Gynecology

## 2023-05-07 DIAGNOSIS — Z1231 Encounter for screening mammogram for malignant neoplasm of breast: Secondary | ICD-10-CM | POA: Diagnosis present

## 2024-04-18 ENCOUNTER — Other Ambulatory Visit: Payer: Self-pay | Admitting: Obstetrics and Gynecology

## 2024-04-18 DIAGNOSIS — Z1231 Encounter for screening mammogram for malignant neoplasm of breast: Secondary | ICD-10-CM

## 2024-06-17 ENCOUNTER — Ambulatory Visit
Admission: RE | Admit: 2024-06-17 | Discharge: 2024-06-17 | Disposition: A | Discharge status: Home / Self Care | Source: Ambulatory Visit | Attending: Obstetrics and Gynecology | Admitting: Obstetrics and Gynecology

## 2024-06-17 DIAGNOSIS — Z1231 Encounter for screening mammogram for malignant neoplasm of breast: Secondary | ICD-10-CM | POA: Insufficient documentation
